# Patient Record
Sex: Female | Born: 1950 | Race: White | Hispanic: No | Marital: Married | Smoking: Never smoker
Health system: Southern US, Community
[De-identification: ages and names within clinical notes are randomized; demographics above are authoritative.]

## PROBLEM LIST (undated history)

## (undated) DIAGNOSIS — I10 Essential (primary) hypertension: Secondary | ICD-10-CM

## (undated) DIAGNOSIS — E039 Hypothyroidism, unspecified: Secondary | ICD-10-CM

## (undated) DIAGNOSIS — G4730 Sleep apnea, unspecified: Secondary | ICD-10-CM

## (undated) DIAGNOSIS — B1910 Unspecified viral hepatitis B without hepatic coma: Secondary | ICD-10-CM

## (undated) DIAGNOSIS — K589 Irritable bowel syndrome without diarrhea: Secondary | ICD-10-CM

## (undated) DIAGNOSIS — F419 Anxiety disorder, unspecified: Secondary | ICD-10-CM

## (undated) DIAGNOSIS — J45909 Unspecified asthma, uncomplicated: Secondary | ICD-10-CM

## (undated) DIAGNOSIS — M1990 Unspecified osteoarthritis, unspecified site: Secondary | ICD-10-CM

## (undated) DIAGNOSIS — F32A Depression, unspecified: Secondary | ICD-10-CM

## (undated) DIAGNOSIS — Z87442 Personal history of urinary calculi: Secondary | ICD-10-CM

## (undated) HISTORY — DX: Unspecified osteoarthritis, unspecified site: M19.90

## (undated) HISTORY — PX: LITHOTRIPSY: SUR834

## (undated) HISTORY — PX: KYPHOPLASTY: SHX5884

## (undated) HISTORY — PX: BREAST LUMPECTOMY: SHX2

## (undated) HISTORY — DX: Hypothyroidism, unspecified: E03.9

## (undated) HISTORY — PX: TOTAL ABDOMINAL HYSTERECTOMY: SHX209

## (undated) HISTORY — PX: OTHER SURGICAL HISTORY: SHX169

## (undated) HISTORY — DX: Unspecified asthma, uncomplicated: J45.909

## (undated) HISTORY — DX: Unspecified viral hepatitis B without hepatic coma: B19.10

## (undated) HISTORY — PX: KNEE SURGERY: SHX244

## (undated) HISTORY — PX: ANKLE SURGERY: SHX546

## (undated) HISTORY — PX: CHOLECYSTECTOMY: SHX55

## (undated) HISTORY — PX: BREAST BIOPSY: SHX20

## (undated) HISTORY — DX: Irritable bowel syndrome, unspecified: K58.9

## (undated) HISTORY — DX: Sleep apnea, unspecified: G47.30

---

## 2009-03-16 ENCOUNTER — Ambulatory Visit (HOSPITAL_COMMUNITY): Admission: RE | Admit: 2009-03-16 | Discharge: 2009-03-16 | Payer: Self-pay | Admitting: Orthopedic Surgery

## 2009-05-17 ENCOUNTER — Ambulatory Visit (HOSPITAL_COMMUNITY): Admission: RE | Admit: 2009-05-17 | Discharge: 2009-05-17 | Payer: Self-pay | Admitting: Orthopedic Surgery

## 2010-05-23 LAB — DIFFERENTIAL
Basophils Absolute: 0.1 10*3/uL (ref 0.0–0.1)
Lymphocytes Relative: 21 % (ref 12–46)
Neutro Abs: 7.5 10*3/uL (ref 1.7–7.7)
Neutrophils Relative %: 68 % (ref 43–77)

## 2010-05-23 LAB — CBC
HCT: 40.7 % (ref 36.0–46.0)
Hemoglobin: 13.6 g/dL (ref 12.0–15.0)
Platelets: 212 10*3/uL (ref 150–400)

## 2010-05-23 LAB — BASIC METABOLIC PANEL
CO2: 29 mEq/L (ref 19–32)
Chloride: 105 mEq/L (ref 96–112)
Creatinine, Ser: 0.75 mg/dL (ref 0.4–1.2)
Potassium: 4.3 mEq/L (ref 3.5–5.1)

## 2010-05-23 LAB — APTT: aPTT: 32 seconds (ref 24–37)

## 2010-05-23 LAB — PROTIME-INR: Prothrombin Time: 14.4 seconds (ref 11.6–15.2)

## 2011-01-13 IMAGING — CR DG ORBITS FOR FOREIGN BODY
2 series · 2 of 2 positions shown · non-contrast
Comparison: none

*RADIOLOGY  REPORT*
CLINICAL DATA: Metal working/exposure;  clearance prior to MRI

ORBITS FOR FOREIGN BODY - 2 VIEW

[view not recorded (1 of 2)]
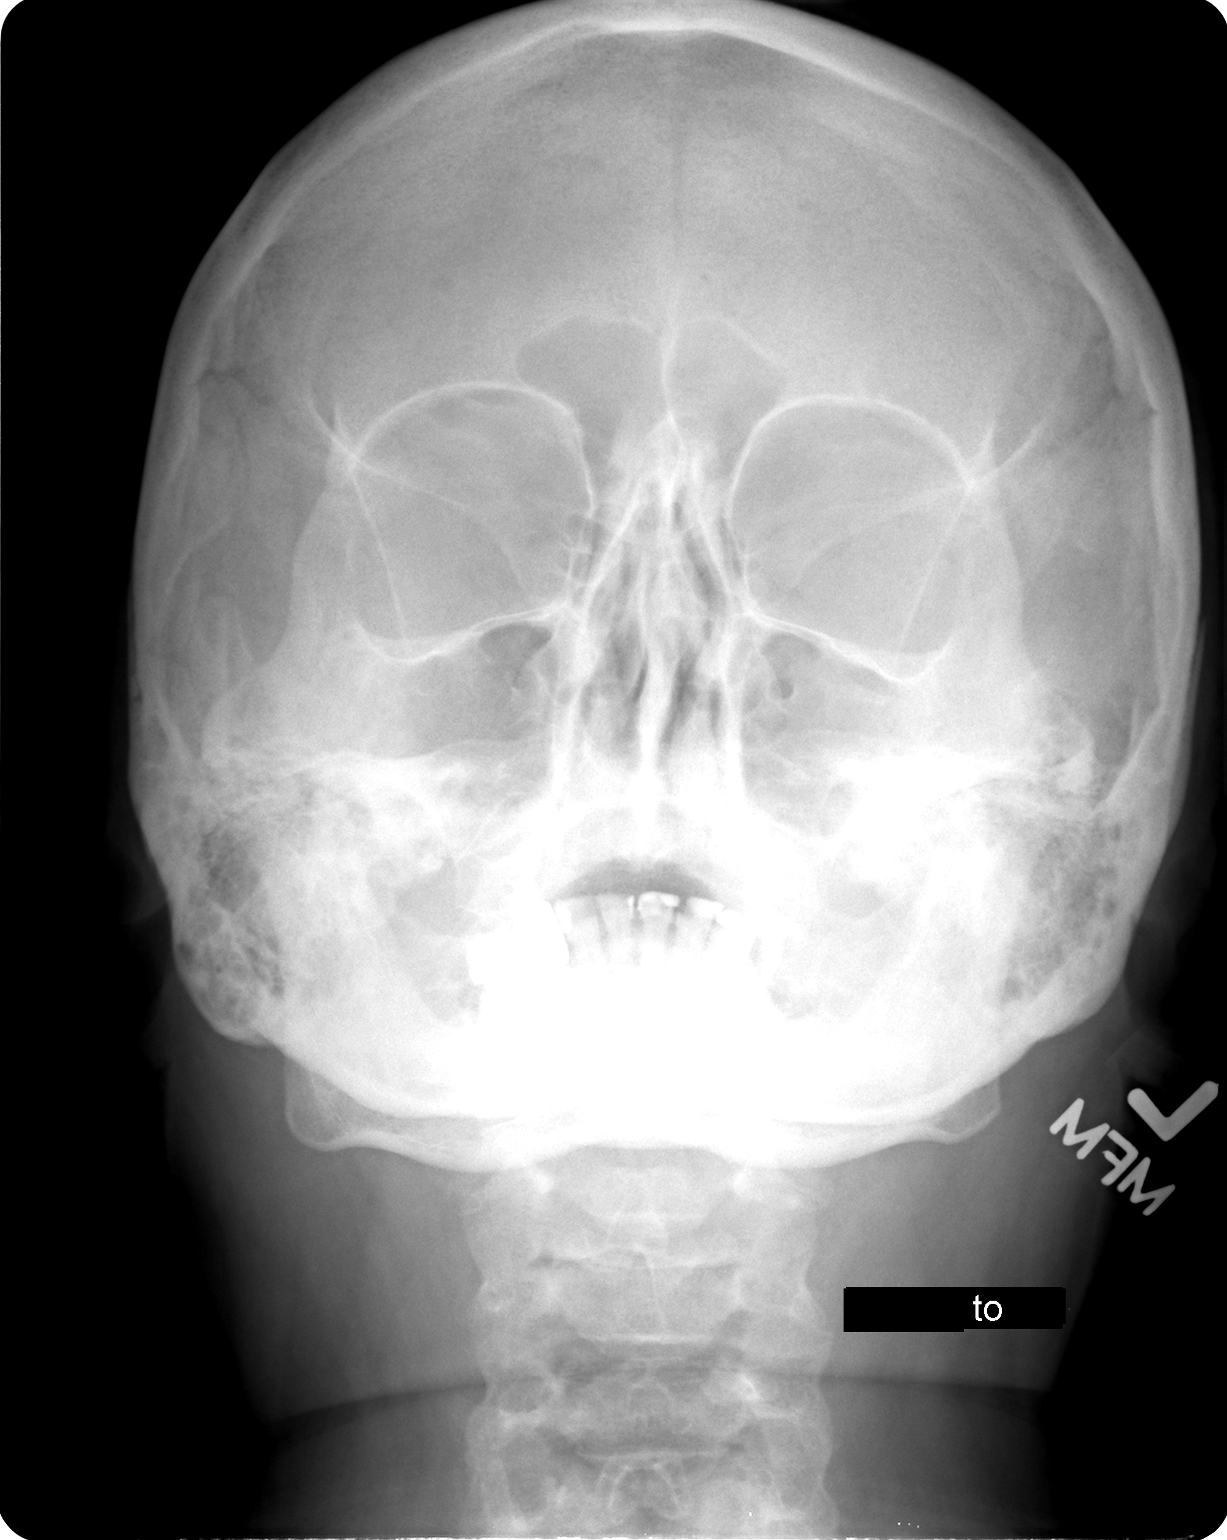

[view not recorded (2 of 2)]
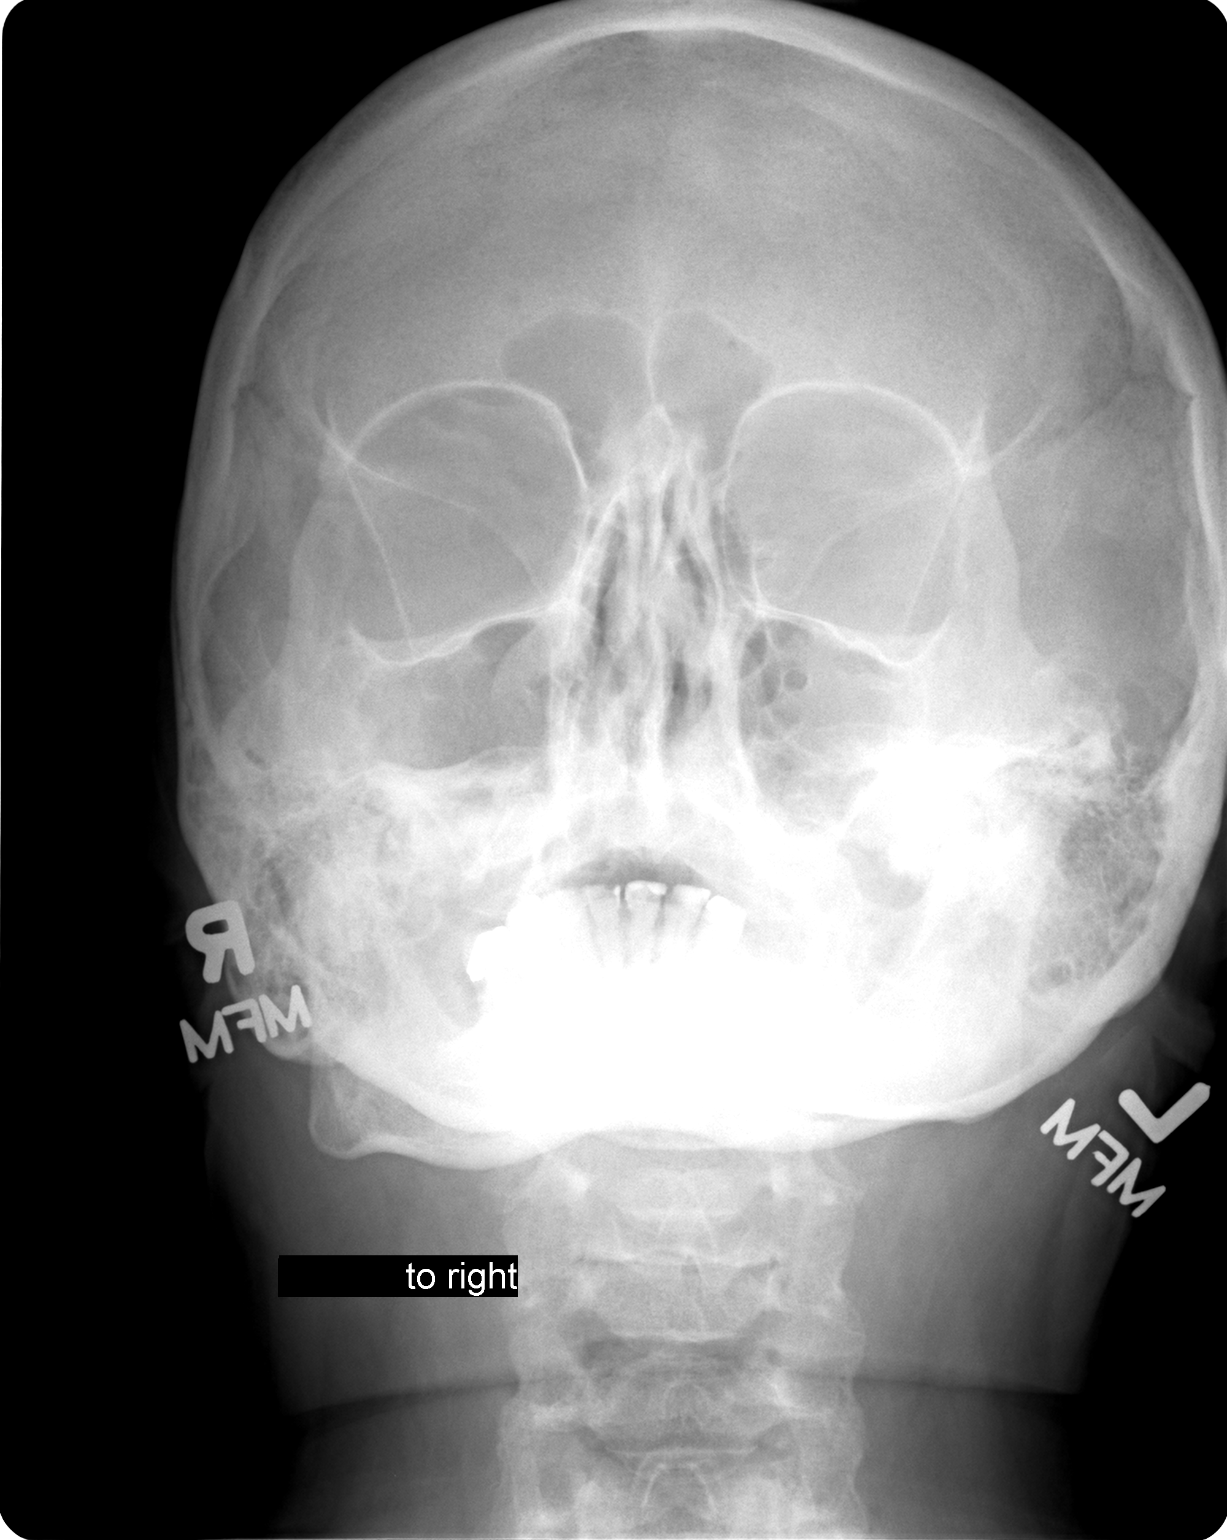

[2 of 2 positions shown; findings below may reference images not displayed]

FINDINGS: There is no evidence of metallic foreign body within the
orbits.  No significant bone abnormality identified.

IMPRESSION
No evidence of metallic foreign body within the orbits.

## 2011-03-16 IMAGING — CR DG CHEST 2V
2 series · 2 of 2 positions shown · non-contrast
Comparison: None.

CLINICAL DATA: Preop for right knee surgery

CHEST - 2 VIEW

[view not recorded (1 of 2)]
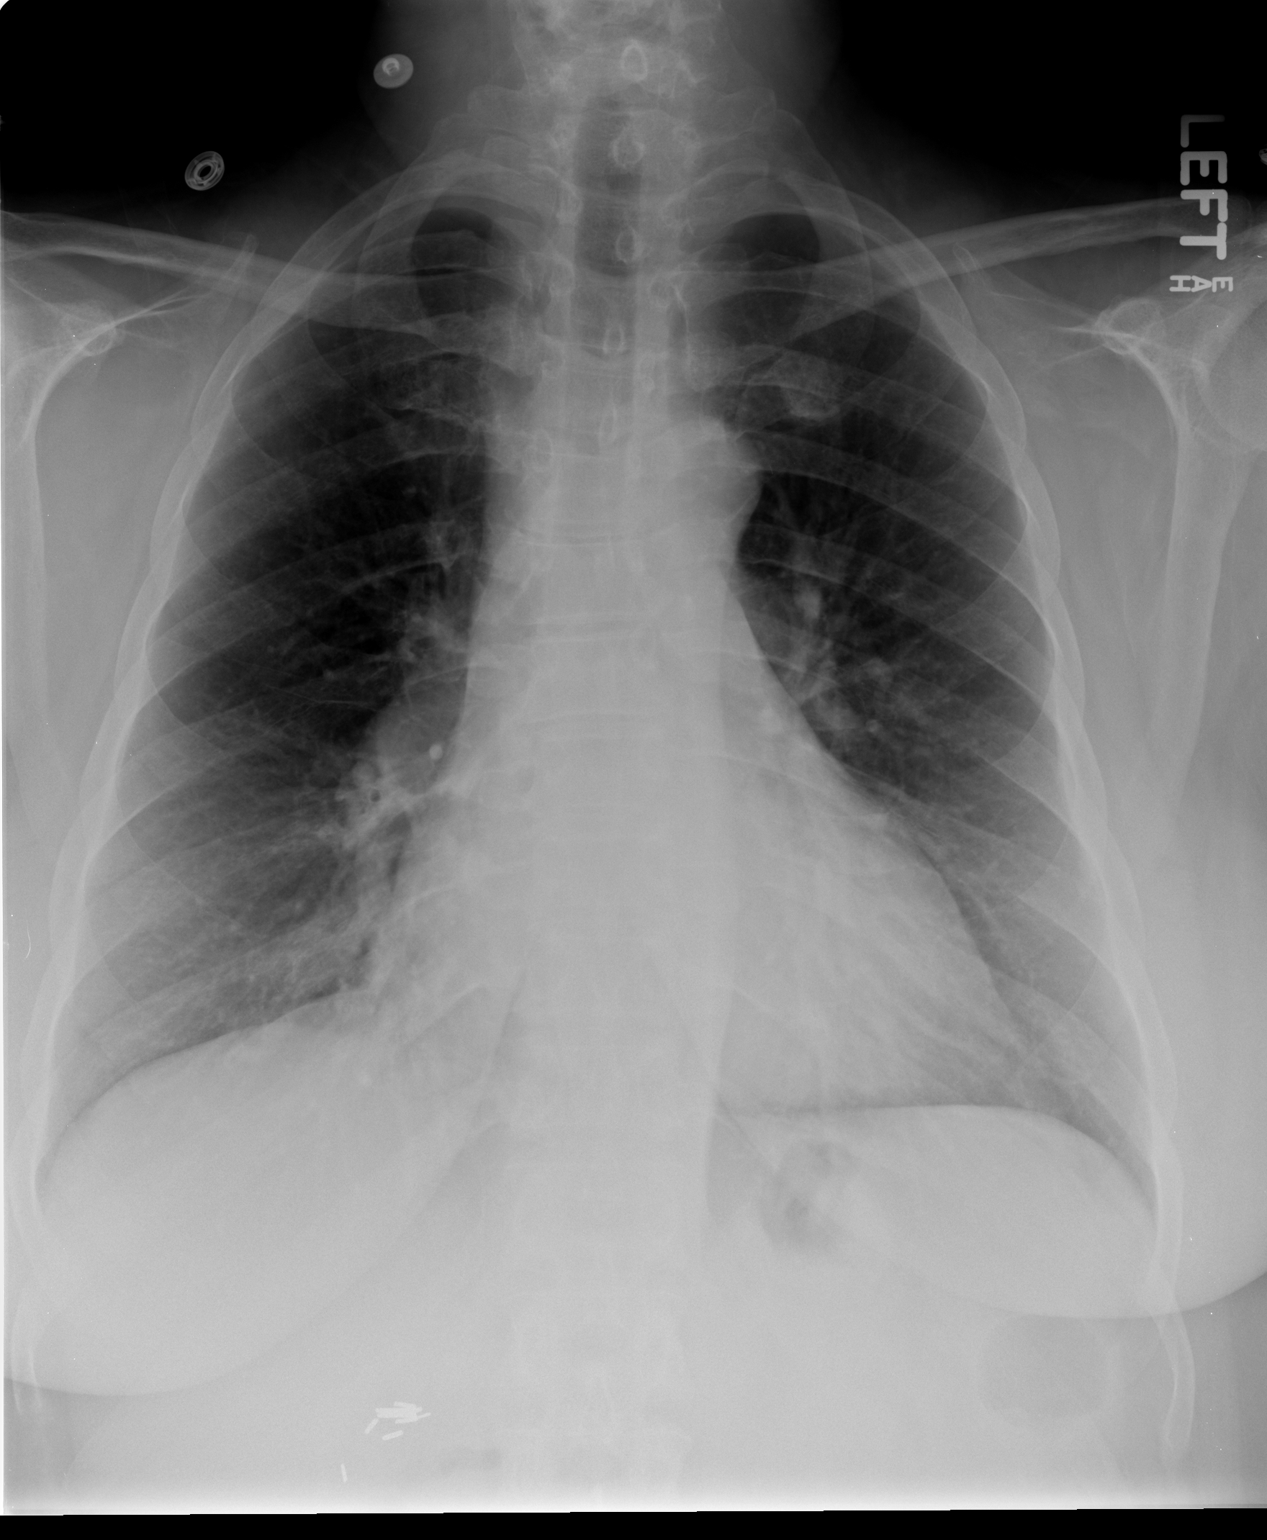

[view not recorded (2 of 2)]
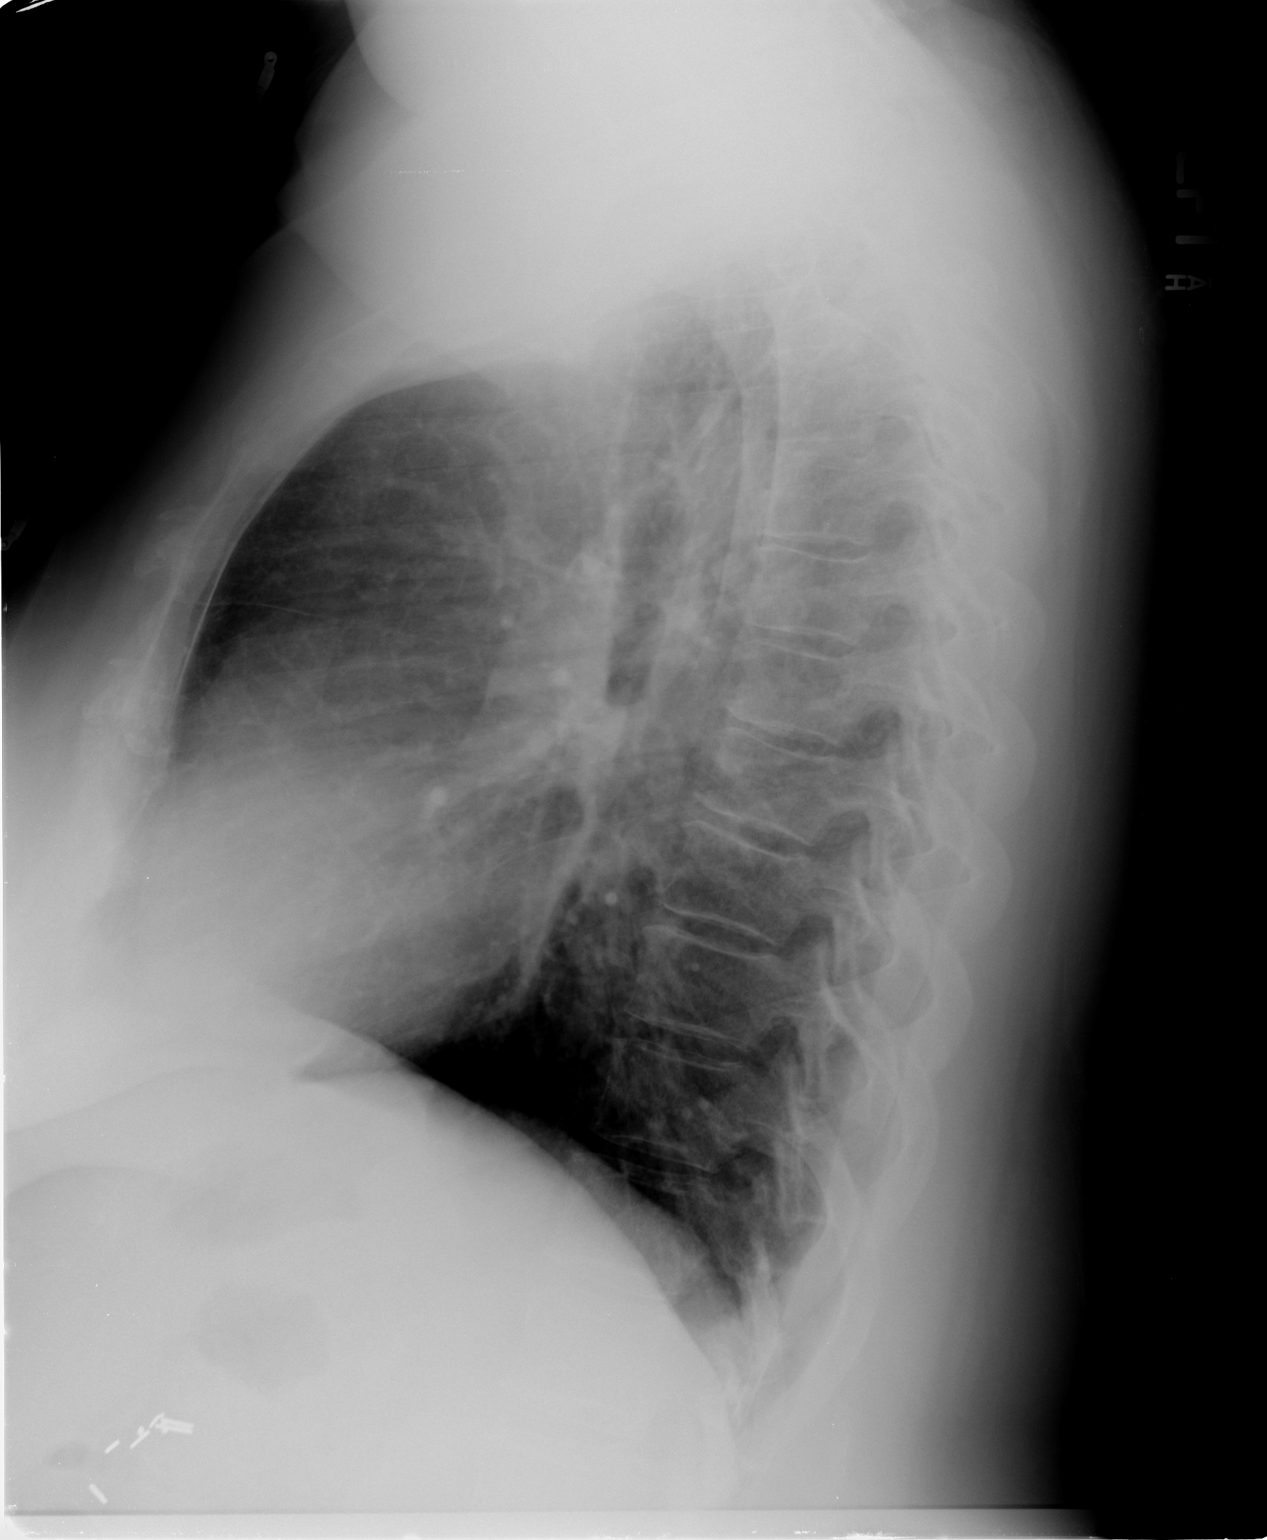

[2 of 2 positions shown; findings below may reference images not displayed]

FINDINGS: Borderline cardiomegaly noted.  No acute infiltrate or
pleural effusion.  No pulmonary edema.  Post cholecystectomy
surgical clips are noted right upper quadrant.
IMPRESSION: No active disease.  Borderline cardiomegaly.

## 2012-10-10 ENCOUNTER — Ambulatory Visit: Payer: Self-pay | Admitting: Endocrinology

## 2013-08-18 ENCOUNTER — Other Ambulatory Visit (INDEPENDENT_AMBULATORY_CARE_PROVIDER_SITE_OTHER): Payer: BC Managed Care – PPO

## 2013-08-18 ENCOUNTER — Other Ambulatory Visit: Payer: Self-pay | Admitting: *Deleted

## 2013-08-18 DIAGNOSIS — E039 Hypothyroidism, unspecified: Secondary | ICD-10-CM

## 2013-08-18 LAB — T4, FREE: Free T4: 0.54 ng/dL — ABNORMAL LOW (ref 0.60–1.60)

## 2013-08-18 LAB — TSH: TSH: 1.72 u[IU]/mL (ref 0.35–4.50)

## 2013-08-22 ENCOUNTER — Ambulatory Visit (INDEPENDENT_AMBULATORY_CARE_PROVIDER_SITE_OTHER): Payer: BC Managed Care – PPO | Admitting: Endocrinology

## 2013-08-22 ENCOUNTER — Encounter: Payer: Self-pay | Admitting: Endocrinology

## 2013-08-22 VITALS — BP 104/70 | HR 64 | Temp 98.9°F | Ht 69.0 in | Wt 280.0 lb

## 2013-08-22 DIAGNOSIS — E039 Hypothyroidism, unspecified: Secondary | ICD-10-CM | POA: Insufficient documentation

## 2013-08-22 NOTE — Progress Notes (Signed)
Patient ID: Hannah Holt, female   DOB: 22-Aug-1950, 63 y.o.   MRN: [id number]    Reason for Appointment:  Hypothyroidism, followup visit    History of Present Illness:   The hypothyroidism was first diagnosed in 1980s At that time she was having some fatigue when she was tested  and was found to be hypothyroid and was started on Synthroid.  Previously had done fairly well with Synthroid but had been on significantly variable doses over the last several years However for several years had been feeling consistently fatigued This was despite treatment for sleep apnea In 2011 she was empirically tried on Armour Thyroid instead of Synthroid With this she felt more alert although continued to have fatigue Subsequently had been requiring relatively larger doses of Armour Thyroid but has been on the same dose for at least a year now  TSH was 2.96 in 5/14 and she was continued on 120 mg of Armour Thyroid She is again complaining of feeling tired although does not have cold sensitivity or hair loss .            The patient is taking the thyroid supplement very regularly in the morning before breakfast. Not taking any calcium or iron supplements with the thyroid supplement. She prefers the smaller tablets and takes 2 of the 60 mg     Appointment on 08/18/2013  Component Date Value Ref Range Status  . TSH 08/18/2013 1.72  0.35 - 4.50 uIU/mL Final  . Free T4 08/18/2013 0.54* 0.60 - 1.60 ng/dL Final      Medication List       This list is accurate as of: 08/22/13 11:59 PM.  Always use your most recent med list.               ARMOUR THYROID 60 MG tablet  Generic drug:  thyroid        Allergies: Not on File  Past Medical History  Diagnosis Date  . Sleep apnea     No past surgical history on file.  Family History  Problem Relation Age of Onset  . Heart disease Mother   . Heart disease Father   . Thyroid disease Neg Hx     Social History:  has no tobacco, alcohol, and  drug history on file.  REVIEW Of SYSTEMS:  Sleep apnea is being treated    Examination:   BP 104/70  Pulse 64  Temp(Src) 98.9 F (37.2 C) (Oral)  Ht 5\' 9"  (1.753 m)  Wt 280 lb (127.007 kg)  BMI 41.33 kg/m2  SpO2 94%  GENERAL APPEARANCE: Alert, no puffiness of the face or eyes  NECK: Thyroid is not palpable           NEUROLOGIC EXAM:  biceps reflexes show normal relaxation Skin: Not unusual dry    Assessment   Hypothyroidism, primary and long-standing She appears to have consistently normal TSH level now with 120 mg of Armour Thyroid which she is compliant Her fatigue is unrelated to her hypothyroidism and she will followup with her PCP for this    Treatment:   Continue same dosage before breakfast daily. Avoid taking any calcium or iron supplements with the thyroid supplement.    KUMAR,AJAY 08/23/2013, 9:32 PM

## 2013-09-05 ENCOUNTER — Other Ambulatory Visit: Payer: Self-pay | Admitting: *Deleted

## 2013-09-05 MED ORDER — THYROID 60 MG PO TABS: ORAL_TABLET | ORAL | Status: DC

## 2013-09-08 ENCOUNTER — Telehealth: Payer: Self-pay | Admitting: Endocrinology

## 2013-09-08 ENCOUNTER — Other Ambulatory Visit: Payer: Self-pay | Admitting: *Deleted

## 2013-09-08 MED ORDER — LEVOTHYROXINE SODIUM 150 MCG PO TABS: 150.0000 ug | ORAL_TABLET | Freq: Every day | ORAL | Status: DC

## 2013-09-08 NOTE — Telephone Encounter (Signed)
Please see below and advise.

## 2013-09-08 NOTE — Telephone Encounter (Signed)
rx sent, unable to reach patient by phone at this time.

## 2013-09-08 NOTE — Telephone Encounter (Signed)
Patient is requesting a change of medication, she is not able to afford the Armour Thyroid 60 mg, could a generic brand be prescribed. Please advise

## 2013-09-08 NOTE — Telephone Encounter (Signed)
Do not think there is a generic for Armour Thyroid unless the pharmacist can find one. She will need to switch back to levothyroxine and use 150 mcg daily. Will also need to recheck her thyroid level in about 2 months

## 2013-09-10 ENCOUNTER — Other Ambulatory Visit: Payer: Self-pay | Admitting: *Deleted

## 2013-09-10 DIAGNOSIS — E039 Hypothyroidism, unspecified: Secondary | ICD-10-CM

## 2013-11-11 ENCOUNTER — Other Ambulatory Visit (INDEPENDENT_AMBULATORY_CARE_PROVIDER_SITE_OTHER): Payer: BC Managed Care – PPO

## 2013-11-11 DIAGNOSIS — E039 Hypothyroidism, unspecified: Secondary | ICD-10-CM

## 2013-11-11 LAB — T4, FREE: Free T4: 1.07 ng/dL (ref 0.60–1.60)

## 2013-11-11 LAB — TSH: TSH: 0.72 u[IU]/mL (ref 0.35–4.50)

## 2013-11-14 NOTE — Progress Notes (Signed)
Quick Note:  Not clear why she had thyroid tests done without followup appointment ______

## 2013-11-17 ENCOUNTER — Ambulatory Visit (INDEPENDENT_AMBULATORY_CARE_PROVIDER_SITE_OTHER): Payer: BC Managed Care – PPO | Admitting: Endocrinology

## 2013-11-17 ENCOUNTER — Encounter: Payer: Self-pay | Admitting: Endocrinology

## 2013-11-17 VITALS — BP 90/52 | HR 64 | Temp 98.1°F | Resp 16 | Ht 69.0 in | Wt 281.8 lb

## 2013-11-17 DIAGNOSIS — E039 Hypothyroidism, unspecified: Secondary | ICD-10-CM

## 2013-11-17 NOTE — Progress Notes (Addendum)
Patient ID: Hannah Holt, female   DOB: Dec 07, 1950, 63 y.o.   MRN: [id number]    Reason for Appointment:  Hypothyroidism, followup visit    History of Present Illness:   The hypothyroidism was first diagnosed in 1980s At that time she was having some fatigue when she was tested  and was found to be hypothyroid and was started on Synthroid.  Previously had done fairly well with Synthroid but had been on significantly variable doses over several years However for several years has been feeling consistently fatigued This was despite treatment for sleep apnea  In 2011 she was empirically tried on Armour Thyroid instead of Synthroid With this she felt more alert although continued to have fatigue Subsequently had been requiring relatively larger doses of Armour Thyroid  Because of cost of Armour Thyroid she asked for a less expensive option and is back on levothyroxine since 7/15  She does not feel any different with switching to levothyroxine. She says she tends to get sleepy during the day even though she wakes up feeling refreshed, this is chronic She does not have cold sensitivity or hair loss .            The patient is taking the thyroid supplement very regularly in the morning before breakfast.  Not taking any calcium or iron supplements with the thyroid supplement.   Lab Results  Component Value Date   FREET4 1.07 11/11/2013   FREET4 0.54* 08/18/2013   TSH 0.72 11/11/2013   TSH 1.72 08/18/2013      Appointment on 11/11/2013  Component Date Value Ref Range Status  . TSH 11/11/2013 0.72  0.35 - 4.50 uIU/mL Final  . Free T4 11/11/2013 1.07  0.60 - 1.60 ng/dL Final      Medication List       This list is accurate as of: 11/17/13 10:25 AM.  Always use your most recent med list.               levothyroxine 150 MCG tablet  Commonly known as:  SYNTHROID, LEVOTHROID  Take 1 tablet (150 mcg total) by mouth daily.        Allergies: No Known Allergies  Past Medical  History  Diagnosis Date  . Sleep apnea     No past surgical history on file.  Family History  Problem Relation Age of Onset  . Heart disease Mother   . Heart disease Father   . Thyroid disease Neg Hx     Social History:  has no tobacco, alcohol, and drug history on file.  REVIEW Of SYSTEMS:  Sleep apnea is being treated with CPAP    Examination:   BP 90/52  Pulse 64  Temp(Src) 98.1 F (36.7 C)  Resp 16  Ht 5\' 9"  (1.753 m)  Wt 281 lb 12.8 oz (127.824 kg)  BMI 41.60 kg/m2  SpO2 97%  GENERAL APPEARANCE: Alert, no puffiness of the face or eyes  NECK: Thyroid is not palpable           NEUROLOGIC EXAM:  biceps reflexes show normal relaxation Skin: Not unusual dry    Assessment   Hypothyroidism, primary and long-standing She appears to have  a normal TSH level now with switching to 150 mcg of levothyroxine, previously on 120 mg of Armour Thyroid  Her fatigue and somnolence is unrelated to her hypothyroidism and not different with levothyroxine  Reassured the patient that her obstructive airway symptoms have no relationship to her thyroid   Treatment:  Continue same dosage before breakfast daily. Avoid taking any calcium or iron supplements with the thyroid supplement.    Continue followup with PCP for daytime somnolence  Hannah Holt 11/17/2013, 10:37 AM

## 2014-01-04 ENCOUNTER — Other Ambulatory Visit: Payer: Self-pay | Admitting: Endocrinology

## 2014-02-06 HISTORY — PX: COLONOSCOPY: SHX174

## 2014-08-13 ENCOUNTER — Other Ambulatory Visit: Payer: BC Managed Care – PPO

## 2014-08-18 ENCOUNTER — Ambulatory Visit: Payer: BC Managed Care – PPO | Admitting: Endocrinology

## 2014-09-08 ENCOUNTER — Other Ambulatory Visit (INDEPENDENT_AMBULATORY_CARE_PROVIDER_SITE_OTHER): Payer: Self-pay

## 2014-09-08 DIAGNOSIS — E039 Hypothyroidism, unspecified: Secondary | ICD-10-CM

## 2014-09-08 LAB — TSH: TSH: 0.48 u[IU]/mL (ref 0.35–4.50)

## 2014-09-08 LAB — T4, FREE: FREE T4: 0.88 ng/dL (ref 0.60–1.60)

## 2014-09-11 ENCOUNTER — Encounter: Payer: Self-pay | Admitting: Endocrinology

## 2014-09-11 ENCOUNTER — Ambulatory Visit (INDEPENDENT_AMBULATORY_CARE_PROVIDER_SITE_OTHER): Payer: Medicare Other | Admitting: Endocrinology

## 2014-09-11 VITALS — BP 124/68 | HR 63 | Temp 98.3°F | Resp 16 | Ht 69.0 in | Wt 282.6 lb

## 2014-09-11 DIAGNOSIS — E038 Other specified hypothyroidism: Secondary | ICD-10-CM

## 2014-09-11 NOTE — Progress Notes (Signed)
Patient ID: Hannah Holt, female   DOB: 1950-06-23, 64 y.o.   MRN: [id number]    Reason for Appointment:  Hypothyroidism, followup visit    History of Present Illness:   The hypothyroidism was first diagnosed in 1980s At that time she was having some fatigue when she was tested  and was found to be hypothyroid and was started on Synthroid.  Previously had done fairly well with Synthroid but had been on significantly variable doses over the last several years However for several years had been feeling consistently fatigued This was despite treatment for sleep apnea In 2011 she was empirically tried on Armour Thyroid 120 mg instead of Synthroid  With this she felt more alert although continued to have fatigue  Subsequently had gone back to the Synthroid brand because of the cost and she does not think she felt any significantly different with this She has been on a stable dose of 150 g of levothyroxine  She is again complaining of feeling sleepy although does not have cold sensitivity or hair loss .            The patient is taking the thyroid supplement very regularly in the morning before breakfast.  Her current Generic is made by [facility] lab Not taking any calcium or iron supplements with the thyroid supplement.  Her TSH levels have not been fairly stable  Lab Results  Component Value Date   TSH 0.48 09/08/2014   TSH 0.72 11/11/2013   TSH 1.72 08/18/2013   FREET4 0.88 09/08/2014   FREET4 1.07 11/11/2013   FREET4 0.54* 08/18/2013      Medication List       This list is accurate as of: 09/11/14  9:10 AM.  Always use your most recent med list.               levothyroxine 150 MCG tablet  Commonly known as:  SYNTHROID, LEVOTHROID  TAKE 1 TABLET BY MOUTH EVERY DAY        Allergies: No Known Allergies  Past Medical History  Diagnosis Date  . Sleep apnea     No past surgical history on file.  Family History  Problem Relation Age of Onset  . Heart  disease Mother   . Heart disease Father   . Thyroid disease Neg Hx     Social History:  has no tobacco, alcohol, and drug history on file.  REVIEW Of SYSTEMS:  Sleep apnea is being treated with CPAP She is followed by her primary physician regularly   Examination:   BP 124/68 mmHg  Pulse 63  Temp(Src) 98.3 F (36.8 C)  Resp 16  Ht 5\' 9"  (1.753 m)  Wt 282 lb 9.6 oz (128.187 kg)  BMI 41.71 kg/m2  SpO2 97%  GENERAL APPEARANCE: Alert, no puffiness of the face or eyes  NECK: Thyroid is not palpable           Biceps reflexes show normal relaxation Skin: Not unusual dry    Assessment   Hypothyroidism, primary and long-standing She appears to have  a normal TSH level now with continuing 150 mcg of levothyroxine, previously on 120 mg of Armour Thyroid  Her fatigue/somnolence is unrelated to her hypothyroidism and not different with levothyroxine   Treatment:   Continue same dosage before breakfast daily. Avoid taking any calcium or iron supplements with the thyroid supplement.    Suggested seeing a sleep disorder specialist for her somnolence   Follow-up in one year  [facility]  09/11/2014, 9:10 AM

## 2014-11-07 ENCOUNTER — Other Ambulatory Visit: Payer: Self-pay | Admitting: Endocrinology

## 2014-11-09 MED ORDER — LEVOTHYROXINE SODIUM 150 MCG PO TABS: 150.0000 ug | ORAL_TABLET | Freq: Every day | ORAL | Status: DC

## 2015-09-08 ENCOUNTER — Other Ambulatory Visit (INDEPENDENT_AMBULATORY_CARE_PROVIDER_SITE_OTHER): Payer: Medicare Other

## 2015-09-08 DIAGNOSIS — E038 Other specified hypothyroidism: Secondary | ICD-10-CM | POA: Diagnosis not present

## 2015-09-08 LAB — T4, FREE: FREE T4: 1.66 ng/dL — AB (ref 0.60–1.60)

## 2015-09-08 LAB — TSH: TSH: 0.21 u[IU]/mL — AB (ref 0.35–4.50)

## 2015-09-13 ENCOUNTER — Ambulatory Visit (INDEPENDENT_AMBULATORY_CARE_PROVIDER_SITE_OTHER): Payer: Medicare Other | Admitting: Endocrinology

## 2015-09-13 ENCOUNTER — Encounter: Payer: Self-pay | Admitting: Endocrinology

## 2015-09-13 VITALS — BP 110/64 | HR 69 | Ht 69.0 in | Wt 272.0 lb

## 2015-09-13 DIAGNOSIS — E038 Other specified hypothyroidism: Secondary | ICD-10-CM

## 2015-09-13 DIAGNOSIS — E063 Autoimmune thyroiditis: Secondary | ICD-10-CM

## 2015-09-13 NOTE — Patient Instructions (Signed)
Take 1 pill only daily

## 2015-09-13 NOTE — Progress Notes (Signed)
Patient ID: Crystallee Mancillas, female   DOB: 07/23/1950, 65 y.o.   MRN: [id number]    Reason for Appointment:  Hypothyroidism, followup visit    History of Present Illness:   The hypothyroidism was first diagnosed in 1980s At that time she was having some fatigue when she was tested  and was found to be hypothyroid and was started on Synthroid.  Previously had done fairly well with Synthroid but had been on significantly variable doses over the last several years However for several years had been feeling consistently fatigued This was despite treatment for sleep apnea In 2011 she was empirically tried on Armour Thyroid 120 mg instead of Synthroid  With this she felt more alert although continued to have fatigue  Subsequently had gone back to the Synthroid brand because of the cost and she does not think she felt any significantly different with this She has been on a stable dose of 150 g of levothyroxine for some time  She is again complaining of feeling sleepy although does not have cold sensitivity or weight gain  She now says that she has been adjusting her medication on her own based on what she thinks she needs and she thinks it was causing weight gain.  For some time she had not been taking her medication regularly and for 3 months did not take any at all.  She thinks this helped her with weight loss However for the last 3 weeks she started by taking her medication and was taking 1-1/2 tablets a day .            The patient is taking the thyroid supplement very regularly in the morning before breakfast although rarely may forget and take it after eating.   Her current Generic is made by [facility] lab Not taking any calcium or iron supplements with the thyroid supplement.  Her TSH level is relatively low   Lab Results  Component Value Date   TSH 0.21* 09/08/2015   TSH 0.48 09/08/2014   TSH 0.72 11/11/2013   FREET4 1.66* 09/08/2015   FREET4 0.88 09/08/2014   FREET4  1.07 11/11/2013      Medication List       This list is accurate as of: 09/13/15  9:50 AM.  Always use your most recent med list.               levothyroxine 150 MCG tablet  Commonly known as:  SYNTHROID, LEVOTHROID  Take 1 tablet (150 mcg total) by mouth daily.        Allergies: No Known Allergies  Past Medical History  Diagnosis Date  . Sleep apnea     No past surgical history on file.  Family History  Problem Relation Age of Onset  . Heart disease Mother   . Heart disease Father   . Thyroid disease Neg Hx     Social History:  has no tobacco, alcohol, and drug history on file.  REVIEW Of SYSTEMS:    Wt Readings from Last 3 Encounters:  09/13/15 272 lb (123.378 kg)  09/11/14 282 lb 9.6 oz (128.187 kg)  11/17/13 281 lb 12.8 oz (127.824 kg)   Sleep apnea: She does not use her CPAP regularly  She is followed by her primary physician periodically   Examination:   BP 110/64 mmHg  Pulse 69  Ht 5\' 9"  (1.753 m)  Wt 272 lb (123.378 kg)  BMI 40.15 kg/m2  SpO2 96%  She looks well, no puffiness of  the face or eyes Thyroid is not palpable           Biceps reflexes show normal relaxation, no tremor Skin: Not unusual dry    Assessment   Hypothyroidism, primary and long-standing  Not clear what the degree of hypothyroidism she has shin she was able to stop her medication on her own for 3 months without significant adverse effects Her TSH is relatively low because of her taking a higher dose to last 3 weeks   Discussed with the patient that her fatigue and sleepiness is not related to the thyroid but mostly to the sleep apnea that she is not treating    Treatment:   Continue same dosage but only one tablet daily of the 150 g She will follow-up in 6 weeks to decide on her dose   Aayush Gelpi 09/13/2015, 9:50 AM

## 2015-09-14 ENCOUNTER — Other Ambulatory Visit: Payer: Self-pay | Admitting: Endocrinology

## 2015-10-18 ENCOUNTER — Encounter: Payer: Self-pay | Admitting: Endocrinology

## 2015-10-18 ENCOUNTER — Ambulatory Visit (INDEPENDENT_AMBULATORY_CARE_PROVIDER_SITE_OTHER): Payer: Medicare Other | Admitting: Endocrinology

## 2015-10-18 DIAGNOSIS — E038 Other specified hypothyroidism: Secondary | ICD-10-CM | POA: Diagnosis not present

## 2015-10-18 DIAGNOSIS — E063 Autoimmune thyroiditis: Secondary | ICD-10-CM

## 2015-10-18 LAB — T4, FREE: FREE T4: 1 ng/dL (ref 0.60–1.60)

## 2015-10-18 LAB — TSH: TSH: 0.75 u[IU]/mL (ref 0.35–4.50)

## 2015-10-18 NOTE — Progress Notes (Signed)
Patient ID: Hannah Holt, female   DOB: 1950-04-25, 65 y.o.   MRN: [id number]    Reason for Appointment:  Hypothyroidism, followup visit    History of Present Illness:   The hypothyroidism was first diagnosed in 1980s At that time she was having some fatigue when she was tested  and was found to be hypothyroid and was started on Synthroid.  Previously had done fairly well with Synthroid but had been on significantly variable doses over the last several years However for several years had been feeling consistently fatigued This was despite treatment for sleep apnea In 2011 she was empirically tried on Armour Thyroid 120 mg instead of Synthroid  With this she felt more alert although continued to have fatigue  Subsequently had gone back to the Synthroid brand because of the cost and she does not think she felt any significantly different with this  RECENT HISTORY: She has been on a stable dose of 150 g of levothyroxine for some time However on her last visit in 7/17 she had been very inconsistent with her medication, at times not taking it and right before her visit she was taking 1-1/2 tablets on her own With this her TSH was slightly low and free T4 high  She has been back on 150 g daily as directed and she thinks she feels a little more tired with reducing the dose           The patient is taking the thyroid supplement very regularly in the morning before breakfast although rarely may forget and take it after eating.   Her current Generic is made by [facility] lab Not taking any calcium or iron supplements with the thyroid supplement.  Her TSH level is pending   Lab Results  Component Value Date   TSH 0.21 (L) 09/08/2015   TSH 0.48 09/08/2014   TSH 0.72 11/11/2013   FREET4 1.66 (H) 09/08/2015   FREET4 0.88 09/08/2014   FREET4 1.07 11/11/2013      Medication List       Accurate as of 10/18/15 11:19 AM. Always use your most recent med list.            levothyroxine 150 MCG tablet Commonly known as:  SYNTHROID, LEVOTHROID Take 1 tablet (150 mcg total) by mouth daily.   levothyroxine 150 MCG tablet Commonly known as:  SYNTHROID, LEVOTHROID TAKE 1 TABLET BY MOUTH EVERY DAY       Allergies: No Known Allergies  Past Medical History:  Diagnosis Date  . Sleep apnea     No past surgical history on file.  Family History  Problem Relation Age of Onset  . Heart disease Mother   . Heart disease Father   . Thyroid disease Neg Hx     Social History:  has no tobacco, alcohol, and drug history on file.  REVIEW Of SYSTEMS:   Wt Readings from Last 3 Encounters:  10/18/15 277 lb (125.6 kg)  09/13/15 272 lb (123.4 kg)  09/11/14 282 lb 9.6 oz (128.2 kg)   Sleep apnea: She does use her CPAP regularly, She is wanting to know what sleep apnea means  She is followed by her primary physician periodically   Examination:   BP 118/67   Pulse 67   Ht 5\' 9"  (1.753 m)   Wt 277 lb (125.6 kg)   BMI 40.91 kg/m   She looks well, no puffiness of the face or eyes Skin: Not unusual dry    Assessment  Hypothyroidism, primary and long-standing  She is currently taking 150 g of levothyroxine I again offered her trial of Armour Thyroid but she does not want to more expensive medication Her fatigue is likely to be related to another issue that she needs to address with PCP    Treatment:  Check thyroid levels and decide on dosage and follow-up Continue the levothyroxine made by [facility]   [facility] 10/18/2015, 11:19 AM    Addendum: TSH normal, continue 150 levothyroxine

## 2016-01-11 ENCOUNTER — Other Ambulatory Visit: Payer: Self-pay | Admitting: Endocrinology

## 2016-10-17 ENCOUNTER — Ambulatory Visit (INDEPENDENT_AMBULATORY_CARE_PROVIDER_SITE_OTHER): Payer: Medicare Other | Admitting: Endocrinology

## 2016-10-17 ENCOUNTER — Encounter: Payer: Self-pay | Admitting: Endocrinology

## 2016-10-17 DIAGNOSIS — E063 Autoimmune thyroiditis: Secondary | ICD-10-CM

## 2016-10-17 LAB — TSH: TSH: 2.81 u[IU]/mL (ref 0.35–4.50)

## 2016-10-17 LAB — T3, FREE: T3, Free: 3.1 pg/mL (ref 2.3–4.2)

## 2016-10-17 LAB — T4, FREE: FREE T4: 0.99 ng/dL (ref 0.60–1.60)

## 2016-10-17 NOTE — Progress Notes (Signed)
Patient ID: Hannah Holt, female   DOB: [birth year], 66 y.o.   MRN: [id number]    Reason for Appointment:  Hypothyroidism, followup visit    History of Present Illness:   The hypothyroidism was first diagnosed in 1980s At that time she was having some fatigue when she was tested  and was found to be hypothyroid and was started on Synthroid.  Previously had done fairly well with Synthroid but had been on significantly variable doses over the last several years However for several years had been feeling consistently fatigued This was despite treatment for sleep apnea In 2011 she was empirically tried on Armour Thyroid 120 mg instead of Synthroid  With this she felt more alert although continued to have fatigue  Subsequently had gone back to the Synthroid brand because of the cost and she does not think she felt any significantly different with this  RECENT HISTORY: She has been on a stable dose of 150 g of levothyroxine for some time Previously in 7/17 she had been very inconsistent with her medication, at times not taking it and right before her visit she was taking 1-1/2 tablets on her own With this her TSH was slightly low and free T4 high  She has been on 150 g daily as directed and she is very consistent with this now every morning  She tends to have fatigue which is chronic and this is not any different over the last year Tends to feel somewhat warm which is not unusual, no hair loss  Not taking any calcium or iron supplements with the thyroid supplement.  Her dose was not changed on her last assessment in 09/2015  Her TSH level is pending   Lab Results  Component Value Date   TSH 0.75 10/18/2015   TSH 0.21 (L) 09/08/2015   TSH 0.48 09/08/2014   FREET4 1.00 10/18/2015   FREET4 1.66 (H) 09/08/2015   FREET4 0.88 09/08/2014    Allergies as of 10/17/2016   No Known Allergies     Medication List       Accurate as of 10/17/16 10:30 AM. Always use your  most recent med list.          levothyroxine 150 MCG tablet Commonly known as:  SYNTHROID, LEVOTHROID TAKE 1 TABLET BY MOUTH EVERY DAY       Allergies: No Known Allergies  Past Medical History:  Diagnosis Date  . Sleep apnea     No past surgical history on file.  Family History  Problem Relation Age of Onset  . Heart disease Mother   . Heart disease Father   . Thyroid disease Neg Hx     Social History:  reports that she has never smoked. She has never used smokeless tobacco. Her alcohol and drug histories are not on file.  REVIEW Of SYSTEMS:  She has gained a lot of weight  Wt Readings from Last 3 Encounters:  10/17/16 293 lb 6.4 oz (133.1 kg)  10/18/15 277 lb (125.6 kg)  09/13/15 272 lb (123.4 kg)   Sleep apnea: She does use her CPAP regularly, Recently seen by PCP  She is having some edema recently on her ankles    Examination:   BP 116/70   Pulse 70   Ht 5\' 9"  (1.753 m)   Wt 293 lb 6.4 oz (133.1 kg)   SpO2 96%   BMI 43.33 kg/m   She looks well, no puffiness of the hands or eyes Thyroid not palpable Biceps reflexes  are normal Skin: Not unusual dry    Assessment   Hypothyroidism, primary and long-standing  She is currently taking 150 g of levothyroxine once daily She does tend to have fatigue which is probably nonspecific Her weight gain is unrelated to her hypothyroidism  Edema: Clear of the etiology, discussed that usually this is not secondary to sleep apnea   Treatment:  Check thyroid levels including free T3 and decide on dosage Discussed that if free T3 is relatively low may consider combination of levothyroxine and Cytomel which should not be as expensive as Armour Thyroid  Total visit time 15 minutes  Telisa Ohlsen 10/17/2016, 10:30 AM    Addendum: TSH normal, continue 150 levothyroxine

## 2016-10-17 NOTE — Progress Notes (Signed)
Please call to let patient know that the lab results are all normal and she can continue the same dose and follow-up in one year

## 2017-05-15 ENCOUNTER — Encounter: Payer: Self-pay | Admitting: Gastroenterology

## 2017-10-19 ENCOUNTER — Ambulatory Visit: Payer: Medicare Other | Admitting: Endocrinology

## 2018-04-16 ENCOUNTER — Encounter: Payer: Self-pay | Admitting: Gastroenterology

## 2018-04-25 ENCOUNTER — Encounter: Payer: Self-pay | Admitting: Gastroenterology

## 2018-04-30 ENCOUNTER — Encounter: Payer: Self-pay | Admitting: Gastroenterology

## 2018-04-30 ENCOUNTER — Ambulatory Visit (INDEPENDENT_AMBULATORY_CARE_PROVIDER_SITE_OTHER): Payer: Medicare Other | Admitting: Gastroenterology

## 2018-04-30 ENCOUNTER — Encounter (INDEPENDENT_AMBULATORY_CARE_PROVIDER_SITE_OTHER): Payer: Self-pay

## 2018-04-30 VITALS — BP 108/72 | HR 72 | Ht 70.0 in | Wt 284.5 lb

## 2018-04-30 DIAGNOSIS — K625 Hemorrhage of anus and rectum: Secondary | ICD-10-CM | POA: Diagnosis not present

## 2018-04-30 MED ORDER — HYDROCORTISONE 2.5 % RE CREA: 1.0000 "application " | TOPICAL_CREAM | Freq: Two times a day (BID) | RECTAL | 2 refills | Status: AC

## 2018-04-30 MED ORDER — SUPREP BOWEL PREP KIT 17.5-3.13-1.6 GM/177ML PO SOLN: 1.0000 | ORAL | 0 refills | Status: DC

## 2018-04-30 MED ORDER — HYDROCORTISONE 2.5 % RE CREA: 1.0000 "application " | TOPICAL_CREAM | Freq: Two times a day (BID) | RECTAL | 2 refills | Status: DC

## 2018-04-30 NOTE — Progress Notes (Signed)
Chief Complaint: For colonoscopy  Referring Provider:  Gala Lewandowsky, MD      ASSESSMENT AND PLAN;   #1. Rectal bleeding.  Likely due to hemorrhoids.  R/O other causes.  H/O constipation.  #2. H/O colonic polyps (last colonoscopy 01/2014-poor preparation)  Plan: - Colace 1 tab po qd. - Please obtain previous blood work. (Dr Spero Curb). - HC 2.5 % PR bid x 10 days, 2 refills. - Minimize all nonsteroidals.  Increase water intake. - Colon with 2 day prep (miralax, then suprep). Discussed risks & benefits. (Risks including rare perforation req laparotomy, bleeding after biopsies/polypectomy req blood transfusion, rare chance of missing neoplasms, risks of anesthesia/sedation). Benefits outweigh the risks. Patient agrees to proceed. All the questions were answered. Consent forms given for review.  HPI:    Hannah Holt is a 68 y.o. female  With occ constipation, better with dulcolax Occ rectal bleeding - BRB, in bowl, intermittent x 1.5 months. Had poor prep during the last colonoscopy History of polyps in the remote past Due for repeat colonoscopy anyway  No rectal bleeding currently. Denies having any significant abdominal pain or rectal pain. No nausea/vomiting/heartburn/regurgitation/weight loss.  No family history of colon cancer or colonic polyps in a first-degree relative.  Moved to [place] from [place] 2007  Past GI procedures: -Colonoscopy 02/06/2014 (adult scope up to mid sigmoid colon, switched to pediatric-poor preparation right colon), moderate predominantly left colonic diverticulosis. 03/2005- [place]-had colonic polyps Past Medical History:  Diagnosis Date  . Arthritis   . Asthma   . Hepatitis B   . Hypothyroidism   . IBS (irritable bowel syndrome)   . Sleep apnea   . Sleep apnea     Past Surgical History:  Procedure Laterality Date  . ANKLE SURGERY    . CHOLECYSTECTOMY    . COLONOSCOPY  02/06/2014   Moderate predominantly left colonic  diverticulosis. Internal hemorrhoids. Limited examination due to quality.   Marland Kitchen KNEE SURGERY    . TOTAL ABDOMINAL HYSTERECTOMY      Family History  Problem Relation Age of Onset  . Heart disease Mother   . Heart disease Father   . Thyroid disease Neg Hx   . Colon cancer Neg Hx   . Esophageal cancer Neg Hx     Social History   Tobacco Use  . Smoking status: Never Smoker  . Smokeless tobacco: Never Used  Substance Use Topics  . Alcohol use: Yes    Comment: socially  . Drug use: Never    Current Outpatient Medications  Medication Sig Dispense Refill  . levothyroxine (SYNTHROID, LEVOTHROID) 150 MCG tablet TAKE 1 TABLET BY MOUTH EVERY DAY 30 tablet 3   No current facility-administered medications for this visit.     No Known Allergies  Review of Systems:  Constitutional: Denies fever, chills, diaphoresis, appetite change and has fatigue.  HEENT: Denies photophobia, eye pain, redness, hearing loss, ear pain, congestion, sore throat, rhinorrhea, sneezing, mouth sores, neck pain, neck stiffness and tinnitus.   Respiratory: Denies SOB, DOE, cough, chest tightness,  and wheezing.   Cardiovascular: Denies chest pain, palpitations and leg swelling.  Genitourinary: Denies dysuria, urgency, frequency, hematuria, flank pain and difficulty urinating.  Musculoskeletal: Denies myalgias, back pain, joint swelling, arthralgias and gait problem.  Skin: No rash.  Neurological: Denies dizziness, seizures, syncope, weakness, light-headedness, numbness and headaches.  Hematological: Denies adenopathy. Easy bruising, personal or family bleeding history  Psychiatric/Behavioral: No anxiety or depression.  Has sleeping problems.     Physical  Exam:    BP 108/72   Pulse 72   Ht 5\' 10"  (1.778 m)   Wt 284 lb 8 oz (129 kg)   SpO2 99%   BMI 40.82 kg/m  Filed Weights   04/30/18 1351  Weight: 284 lb 8 oz (129 kg)   Constitutional:  Well-developed, in no acute distress. Psychiatric: Normal mood  and affect. Behavior is normal. HEENT: Pupils normal.  Conjunctivae are normal. No scleral icterus. Neck supple.  Cardiovascular: Normal rate, regular rhythm. No edema Pulmonary/chest: Effort normal and breath sounds normal. No wheezing, rales or rhonchi. Abdominal: Soft, nondistended. Nontender. Bowel sounds active throughout. There are no masses palpable. No hepatomegaly. Rectal: Would like to get it done at the time of colonoscopy. Neurological: Alert and oriented to person place and time. Skin: Skin is warm and dry. No rashes noted.  Data Reviewed: I have personally reviewed following labs and imaging studies  CBC: CBC Latest Ref Rng & Units 05/17/2009  WBC 4.0 - 10.5 K/uL 11.0(H)  Hemoglobin 12.0 - 15.0 g/dL 13.6  Hematocrit 36.0 - 46.0 % 40.7  Platelets 150 - 400 K/uL 212    CMP: CMP Latest Ref Rng & Units 05/17/2009  Glucose 70 - 99 mg/dL 94  BUN 6 - 23 mg/dL 9  Creatinine 0.4 - 1.2 mg/dL 0.75  Sodium 135 - 145 mEq/L 143  Potassium 3.5 - 5.1 mEq/L 4.3  Chloride 96 - 112 mEq/L 105  CO2 19 - 32 mEq/L 29  Calcium 8.4 - 10.5 mg/dL 9.1      Carmell Austria, MD 04/30/2018, 2:11 PM  Cc: Gala Lewandowsky, MD

## 2018-04-30 NOTE — Patient Instructions (Addendum)
If you are age 69 or older, your body mass index should be between 23-30. Your Body mass index is 40.82 kg/m. If this is out of the aforementioned range listed, please consider follow up with your Primary Care Provider.  If you are age 68 or younger, your body mass index should be between 19-25. Your Body mass index is 40.82 kg/m. If this is out of the aformentioned range listed, please consider follow up with your Primary Care Provider.   Please purchase the following medications over the counter and take as directed: Colace once daily.   We have sent the following medications to your pharmacy for you to pick up at your convenience: Suprep Hydrocortisone   Two days before your procedure: Mix 3 packs (or capfuls) of Miralax in 48 ounces of clear liquid and drink at 6pm.   Thank you,  Dr. Jackquline Denmark

## 2018-05-06 ENCOUNTER — Telehealth: Payer: Self-pay | Admitting: Gastroenterology

## 2018-05-06 NOTE — Telephone Encounter (Signed)
Pt has upcoming colonoscopy 3.16.20 and stated that she cannot afford Suprep.

## 2018-05-06 NOTE — Telephone Encounter (Signed)
Called and spoke to patient, patient is unable to drive to [facility] for a sample. I have mailed patient new instructions for Miralax. Patient voiced understanding.

## 2018-05-13 ENCOUNTER — Ambulatory Visit (AMBULATORY_SURGERY_CENTER): Payer: Medicare Other | Admitting: Gastroenterology

## 2018-05-13 ENCOUNTER — Encounter: Payer: Self-pay | Admitting: Gastroenterology

## 2018-05-13 ENCOUNTER — Other Ambulatory Visit: Payer: Self-pay

## 2018-05-13 VITALS — BP 117/69 | HR 72 | Temp 98.2°F | Resp 15 | Ht 70.0 in | Wt 284.0 lb

## 2018-05-13 DIAGNOSIS — D125 Benign neoplasm of sigmoid colon: Secondary | ICD-10-CM

## 2018-05-13 DIAGNOSIS — K640 First degree hemorrhoids: Secondary | ICD-10-CM

## 2018-05-13 DIAGNOSIS — D122 Benign neoplasm of ascending colon: Secondary | ICD-10-CM | POA: Diagnosis present

## 2018-05-13 DIAGNOSIS — K5730 Diverticulosis of large intestine without perforation or abscess without bleeding: Secondary | ICD-10-CM

## 2018-05-13 DIAGNOSIS — K635 Polyp of colon: Secondary | ICD-10-CM | POA: Diagnosis not present

## 2018-05-13 DIAGNOSIS — K648 Other hemorrhoids: Secondary | ICD-10-CM

## 2018-05-13 DIAGNOSIS — K625 Hemorrhage of anus and rectum: Secondary | ICD-10-CM

## 2018-05-13 MED ORDER — SODIUM CHLORIDE 0.9 % IV SOLN: 500.0000 mL | Freq: Once | INTRAVENOUS | Status: DC

## 2018-05-13 MED ORDER — HYDROCORTISONE 2.5 % RE CREA: 1.0000 "application " | TOPICAL_CREAM | Freq: Two times a day (BID) | RECTAL | 6 refills | Status: DC | PRN

## 2018-05-13 NOTE — Op Note (Signed)
[facility] Patient Name: Hannah Holt Procedure Date: 05/13/2018 2:19 PM MRN: [id number] Endoscopist: Jackquline Denmark , MD Age: 68 Referring MD:  Date of Birth: Feb 16, 1951 Gender: Female Account #: [id number] Procedure:                Colonoscopy Indications:              Rectal bleeding, history of colonic polyps. Medicines:                Monitored Anesthesia Care Procedure:                Pre-Anesthesia Assessment:                           - Prior to the procedure, a History and Physical                            was performed, and patient medications and                            allergies were reviewed. The patient's tolerance of                            previous anesthesia was also reviewed. The risks                            and benefits of the procedure and the sedation                            options and risks were discussed with the patient.                            All questions were answered, and informed consent                            was obtained. Prior Anticoagulants: The patient has                            taken no previous anticoagulant or antiplatelet                            agents. ASA Grade Assessment: II - A patient with                            mild systemic disease. After reviewing the risks                            and benefits, the patient was deemed in                            satisfactory condition to undergo the procedure.                           After obtaining informed consent, the colonoscope  was passed under direct vision. Throughout the                            procedure, the patient's blood pressure, pulse, and                            oxygen saturations were monitored continuously. The                            Colonoscope was introduced through the anus and                            advanced to the 3 cm into the ileum. The                            colonoscopy was  technically difficult and complex                            due to a tortuous colon. Successful completion of                            the procedure was aided by applying abdominal                            pressure. The patient tolerated the procedure well.                            The quality of the bowel preparation was adequate                            to identify polyps. Scope In: 2:36:17 PM Scope Out: 3:02:11 PM Scope Withdrawal Time: 0 hours 16 minutes 22 seconds  Total Procedure Duration: 0 hours 25 minutes 54 seconds  Findings:                 Four sessile polyps were found in the distal                            sigmoid colon and proximal ascending colon. The                            polyps were 2 to 6 mm in size. These polyps were                            removed with a cold snare. Resection and retrieval                            were complete. Estimated blood loss: none.                           Multiple medium-mouthed diverticula were found in                            the sigmoid  colon and few in descending colon.                           Non-bleeding internal hemorrhoids were found during                            retroflexion. The hemorrhoids were small.                           The exam was otherwise without abnormality on                            direct and retroflexion views.                           The terminal ileum appeared normal. Complications:            No immediate complications. Estimated Blood Loss:     Estimated blood loss: none. Impression:               -Small colonic polyps s/p polypectomy.                           -Moderate predominantly sigmoid diverticulosis.                           -Small internal hemorrhoids.                           -Otherwise normal colonoscopy to TI. Recommendation:           - Patient has a contact number available for                            emergencies. The signs and symptoms of potential                             delayed complications were discussed with the                            patient. Return to normal activities tomorrow.                            Written discharge instructions were provided to the                            patient.                           - High fiber diet.                           - Continue present medications.                           - Await pathology results.                           -  Repeat colonoscopy for surveillance based on                            pathology results.                           - Use HC Cream 2.5%: Apply externally BID PRN. Jackquline Denmark, MD 05/13/2018 3:07:52 PM This report has been signed electronically.

## 2018-05-13 NOTE — Progress Notes (Signed)
A and O x3. Report to RN. Tolerated MAC anesthesia well.

## 2018-05-13 NOTE — Patient Instructions (Signed)
YOU HAD AN ENDOSCOPIC PROCEDURE TODAY AT [facility]:   Refer to the procedure report that was given to you for any specific questions about what was found during the examination.  If the procedure report does not answer your questions, please call your gastroenterologist to clarify.  If you requested that your care partner not be given the details of your procedure findings, then the procedure report has been included in a sealed envelope for you to review at your convenience later.  YOU SHOULD EXPECT: Some feelings of bloating in the abdomen. Passage of more gas than usual.  Walking can help get rid of the air that was put into your GI tract during the procedure and reduce the bloating. If you had a lower endoscopy (such as a colonoscopy or flexible sigmoidoscopy) you may notice spotting of blood in your stool or on the toilet paper. If you underwent a bowel prep for your procedure, you may not have a normal bowel movement for a few days.  Please Note:  You might notice some irritation and congestion in your nose or some drainage.  This is from the oxygen used during your procedure.  There is no need for concern and it should clear up in a day or so.  SYMPTOMS TO REPORT IMMEDIATELY:   Following lower endoscopy (colonoscopy or flexible sigmoidoscopy):  Excessive amounts of blood in the stool  Significant tenderness or worsening of abdominal pains  Swelling of the abdomen that is new, acute  Fever of 100F or higher  For urgent or emergent issues, a gastroenterologist can be reached at any hour by calling [phone].   DIET:  We do recommend a small meal at first, but then you may proceed to your regular diet.  Drink plenty of fluids but you should avoid alcoholic beverages for 24 hours.  ACTIVITY:  You should plan to take it easy for the rest of today and you should NOT DRIVE or use heavy machinery until tomorrow (because of the sedation medicines used during the test).     FOLLOW UP: Our staff will call the number listed on your records the next business day following your procedure to check on you and address any questions or concerns that you may have regarding the information given to you following your procedure. If we do not reach you, we will leave a message.  However, if you are feeling well and you are not experiencing any problems, there is no need to return our call.  We will assume that you have returned to your regular daily activities without incident.  If any biopsies were taken you will be contacted by phone or by letter within the next 1-3 weeks.  Please call us at [phone] if you have not heard about the biopsies in 3 weeks.    SIGNATURES/CONFIDENTIALITY: You and/or your care partner have signed paperwork which will be entered into your electronic medical record.  These signatures attest to the fact that that the information above on your After Visit Summary has been reviewed and is understood.  Full responsibility of the confidentiality of this discharge information lies with you and/or your care-partner.     Handouts were given to your care partner on polyps, diverticulosis, hemorrhoids, and a high fiber diet with a liberal fluid intake. Rx was sent To [facility] for [facility] cream. You may resume your current medications today. Await biopsy results. Please call if any questions or concerns.

## 2018-05-13 NOTE — Progress Notes (Signed)
No problems noted in the recovery room. maw 

## 2018-05-14 ENCOUNTER — Telehealth: Payer: Self-pay | Admitting: *Deleted

## 2018-05-14 NOTE — Telephone Encounter (Signed)
  Follow up Call-  Call back number 05/13/2018  Post procedure Call Back phone  # [phone]  Permission to leave phone message Yes  Some recent data might be hidden     Patient questions:  Do you have a fever, pain , or abdominal swelling? No. Pain Score  0 *  Have you tolerated food without any problems? Yes.    Have you been able to return to your normal activities? Yes.    Do you have any questions about your discharge instructions: Diet   No. Medications  No. Follow up visit  No.  Do you have questions or concerns about your Care? No.  Actions: * If pain score is 4 or above: No action needed, pain <4.

## 2018-05-21 ENCOUNTER — Encounter: Payer: Self-pay | Admitting: Gastroenterology

## 2020-10-12 ENCOUNTER — Ambulatory Visit (INDEPENDENT_AMBULATORY_CARE_PROVIDER_SITE_OTHER): Payer: Medicare Other

## 2020-10-12 ENCOUNTER — Other Ambulatory Visit: Payer: Self-pay

## 2020-10-12 ENCOUNTER — Ambulatory Visit (INDEPENDENT_AMBULATORY_CARE_PROVIDER_SITE_OTHER): Payer: Medicare Other | Admitting: Sports Medicine

## 2020-10-12 ENCOUNTER — Encounter: Payer: Self-pay | Admitting: Sports Medicine

## 2020-10-12 DIAGNOSIS — M722 Plantar fascial fibromatosis: Secondary | ICD-10-CM

## 2020-10-12 DIAGNOSIS — M216X1 Other acquired deformities of right foot: Secondary | ICD-10-CM

## 2020-10-12 DIAGNOSIS — M7661 Achilles tendinitis, right leg: Secondary | ICD-10-CM

## 2020-10-12 DIAGNOSIS — M79671 Pain in right foot: Secondary | ICD-10-CM

## 2020-10-12 MED ORDER — PREDNISONE 10 MG (21) PO TBPK: ORAL_TABLET | ORAL | 0 refills | Status: DC

## 2020-10-12 NOTE — Progress Notes (Signed)
Subjective: Hannah Holt is a 70 y.o. 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  Patient has tried heel with Biofreeze and icing.  As well for her symptoms.  Patient Active Problem List   Diagnosis Date Noted   Unspecified hypothyroidism 08/22/2013    Current Outpatient Medications on File Prior to Visit  Medication Sig Dispense Refill   levothyroxine (SYNTHROID) 75 MCG tablet levothyroxine 75 mcg tablet  Take 1 tablet every day by oral route before meals.     albuterol (VENTOLIN HFA) 108 (90 Base) MCG/ACT inhaler albuterol sulfate HFA 90 mcg/actuation aerosol inhaler  INHALE 2 PUFFS BY MOUTH EVERY 4 TO 6 HOURS AS NEEDED FOR COUGH AND FOR WHEEZING     fexofenadine-pseudoephedrine (ALLEGRA-D ALLERGY & CONGESTION) 60-120 MG 12 hr tablet Allegra-D 12 Hour 60 mg-120 mg tablet,extended release  TAKE 1 TABLET BY MOUTH EVERY 12 HOURS AS NEEDED FOR SINUS CONGESTION AND EAR POPPING     hydrocortisone (ANUSOL-HC) 2.5 % rectal cream Place 1 application rectally 2 (two) times daily as needed for hemorrhoids or anal itching. 30 g 6   levothyroxine (SYNTHROID, LEVOTHROID) 150 MCG tablet TAKE 1 TABLET BY MOUTH EVERY DAY 30 tablet 3   meclizine (ANTIVERT) 25 MG tablet meclizine 25 mg tablet  Take 1 tablet 3 times a day by oral route as needed.  may cause drowsiness     pantoprazole (PROTONIX) 40 MG tablet pantoprazole 40 mg tablet,delayed release  Take 1 tablet every day by oral route.     SUPREP BOWEL PREP KIT 17.5-3.13-1.6 GM/177ML SOLN Take 1 kit by mouth as directed. 354 mL 0   No current facility-administered medications on file prior to visit.    No Known Allergies  Objective:  General: Alert and oriented x3 in no acute distress  Dermatology: No  open lesions bilateral lower extremities, no webspace macerations, no ecchymosis bilateral, all nails x 10 are well manicured but discolored at the ends right greater than left foot.  Vascular: Dorsalis Pedis and Posterior Tibial pedal pulses 1/4, Capillary Fill Time 3 seconds, + pedal hair growth bilateral, no edema bilateral lower extremities, Temperature gradient within normal limits.  Neurology: Johney Maine sensation intact via light touch bilateral.  Musculoskeletal: Mild tenderness with palpation at insertion of the Achilles at the back greater than the plantar fascial insertion on the bottom of the right heel.  There is calcaneal exostosis with mild soft tissue swelling at the posterior aspect of the right heel.  Achilles tendon feels intact with no nodularity or palpable dell.  Thompson sign is negative.  No other pedal deformities noted.    Gait: Antalgic gait with increased heel off right  Xrays  Right foot   Impression: Normal osseous mineralization. Joint spaces preserved. No fracture/dislocation/boney destruction.  Posterior calcaneal spur present. Kager's triangle intact with no obliteration. No soft tissue abnormalities or radiopaque foreign bodies.   Assessment and Plan: Problem List Items Addressed This Visit   None Visit Diagnoses     Achilles tendinitis of right lower extremity    -  Primary   Relevant Orders   DG Foot Complete Right   Acquired equinus deformity of right foot       Pain of  right heel           -Complete examination performed -Xrays reviewed -Discussed treatement options for Achilles tendinitis greater than plantar fasciitis on right heel -No injection given at this time since most of her pain is at the back of the heel -Rx night splint to use as directed and advocated gentle stretching and icing  -Prescribed prednisone Dosepak for patient to take as directed  -Continue with good supportive shoes and heel lifts  -Advised patient if symptoms fail to  improve 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  Landis Martins, DPM

## 2020-10-12 NOTE — Patient Instructions (Addendum)
Vinegar soaks 1 cup of white distilled vinegar to 8 cups of warm water.  Soak 20 mins. May repeat soak two times per week.  If there is thickness to nails may file nails after soaks or after bath/shower with nail file and apply  vicks or tea tree oil. Apply oil daily to nails after filing for the best result.

## 2022-04-04 NOTE — H&P (Signed)
Patient's anticipated LOS is less than 2 midnights, meeting these requirements: - Younger than 5 - Lives within 1 hour of care - Has a competent adult at home to recover with post-op recover - NO history of  - Chronic pain requiring opiods  - Diabetes  - Coronary Artery Disease  - Heart failure  - Heart attack  - Stroke  - DVT/VTE  - Cardiac arrhythmia  - Respiratory Failure/COPD  - Renal failure  - Anemia  - Advanced Liver disease     Hannah Holt is an 72 y.o. female.    Chief Complaint: right knee pain  HPI: Pt is a 72 y.o. female complaining of right knee pain for multiple years. Pain had continually increased since the beginning. X-rays in the clinic show end-stage arthritic changes of the right knee. Pt has tried various conservative treatments which have failed to alleviate their symptoms, including injections and therapy. Various options are discussed with the patient. Risks, benefits and expectations were discussed with the patient. Patient understand the risks, benefits and expectations and wishes to proceed with surgery.   PCP:  Gala Lewandowsky, MD  D/C Plans: Home  PMH: Past Medical History:  Diagnosis Date   Arthritis    Asthma    Hepatitis B    Hypothyroidism    IBS (irritable bowel syndrome)    Sleep apnea    Sleep apnea     PSH: Past Surgical History:  Procedure Laterality Date   ANKLE SURGERY     CHOLECYSTECTOMY     COLONOSCOPY  02/06/2014   Moderate predominantly left colonic diverticulosis. Internal hemorrhoids. Limited examination due to quality.    KNEE SURGERY     TOTAL ABDOMINAL HYSTERECTOMY      Social History:  reports that she has never smoked. She has never used smokeless tobacco. She reports current alcohol use. She reports that she does not use drugs. BMI: Estimated body mass index is 40.75 kg/m as calculated from the following:   Height as of 05/13/18: '5\' 10"'$  (1.778 m).   Weight as of 05/13/18: 128.8 kg.  No results found  for: "ALBUMIN" Diabetes: Patient does not have a diagnosis of diabetes.     Smoking Status:   reports that she has never smoked. She has never used smokeless tobacco.    Allergies:  No Known Allergies  Medications: No current facility-administered medications for this encounter.   Current Outpatient Medications  Medication Sig Dispense Refill   albuterol (VENTOLIN HFA) 108 (90 Base) MCG/ACT inhaler albuterol sulfate HFA 90 mcg/actuation aerosol inhaler  INHALE 2 PUFFS BY MOUTH EVERY 4 TO 6 HOURS AS NEEDED FOR COUGH AND FOR WHEEZING     fexofenadine-pseudoephedrine (ALLEGRA-D ALLERGY & CONGESTION) 60-120 MG 12 hr tablet Allegra-D 12 Hour 60 mg-120 mg tablet,extended release  TAKE 1 TABLET BY MOUTH EVERY 12 HOURS AS NEEDED FOR SINUS CONGESTION AND EAR POPPING     hydrocortisone (ANUSOL-HC) 2.5 % rectal cream Place 1 application rectally 2 (two) times daily as needed for hemorrhoids or anal itching. 30 g 6   levothyroxine (SYNTHROID) 75 MCG tablet levothyroxine 75 mcg tablet  Take 1 tablet every day by oral route before meals.     levothyroxine (SYNTHROID, LEVOTHROID) 150 MCG tablet TAKE 1 TABLET BY MOUTH EVERY DAY 30 tablet 3   meclizine (ANTIVERT) 25 MG tablet meclizine 25 mg tablet  Take 1 tablet 3 times a day by oral route as needed.  may cause drowsiness     pantoprazole (PROTONIX) 40 MG tablet  pantoprazole 40 mg tablet,delayed release  Take 1 tablet every day by oral route.     predniSONE (STERAPRED UNI-PAK 21 TAB) 10 MG (21) TBPK tablet Take as directed 21 tablet 0   SUPREP BOWEL PREP KIT 17.5-3.13-1.6 GM/177ML SOLN Take 1 kit by mouth as directed. 354 mL 0    No results found for this or any previous visit (from the past 48 hour(s)). No results found.  ROS: Pain with rom of the right lowe extremity  Physical Exam: Alert and oriented 72 y.o. female in no acute distress Cranial nerves 2-12 intact Cervical spine: full rom with no tenderness, nv intact distally Chest:  active breath sounds bilaterally, no wheeze rhonchi or rales Heart: regular rate and rhythm, no murmur Abd: non tender non distended with active bowel sounds Hip is stable with rom  Right knee painful rom with crepitus Nv intact distally No rashes or edema distally  Assessment/Plan Assessment: right knee end stage osteoarthritis  Plan:  Patient will undergo a right total knee by Dr. Veverly Fells at [facility] Risks benefits and expectations were discussed with the patient. Patient understand risks, benefits and expectations and wishes to proceed. Preoperative templating of the joint replacement has been completed, documented, and submitted to the Operating Room personnel in order to optimize intra-operative equipment management.   Merla Riches PA-C, MPAS [facility] is now [employer] [address], [place], [place], [place] [address] Phone: [phone] [email or web address] Facebook  [employer]

## 2022-04-11 NOTE — Patient Instructions (Addendum)
DUE TO COVID-19 ONLY TWO VISITORS  (aged 73 and older)  ARE ALLOWED TO COME WITH YOU AND STAY IN THE WAITING ROOM ONLY DURING PRE OP AND PROCEDURE.   **NO VISITORS ARE ALLOWED IN THE SHORT STAY AREA OR RECOVERY ROOM!!**  IF YOU WILL BE ADMITTED INTO THE HOSPITAL YOU ARE ALLOWED ONLY FOUR SUPPORT PEOPLE DURING VISITATION HOURS ONLY (7 AM -8PM)   The support person(s) must pass our screening, gel in and out, and wear a mask at all times, including in the patient's room. Patients must also wear a mask when staff or their support person are in the room. Visitors GUEST BADGE MUST BE WORN VISIBLY  One adult visitor may remain with you overnight and MUST be in the room by 8 P.M.     Your procedure is scheduled on: 04/21/22   Report to [facility] Main Entrance    Report to admitting at : 7:30 AM   Call this number if you have problems the morning of surgery [phone]   Do not eat food :After Midnight.   After Midnight you may have the following liquids until: 7:00 AM DAY OF SURGERY  Water Black Coffee (sugar ok, NO MILK/CREAM OR CREAMERS)  Tea (sugar ok, NO MILK/CREAM OR CREAMERS) regular and decaf                             Plain Jell-O (NO RED)                                           Fruit ices (not with fruit pulp, NO RED)                                     Popsicles (NO RED)                                                                  Juice: apple, WHITE grape, WHITE cranberry Sports drinks like Gatorade (NO RED)   The day of surgery:  Drink ONE (1) Pre-Surgery Clear Ensure at: 7:00 AM the morning of surgery. Drink in one sitting. Do not sip.  This drink was given to you during your hospital  pre-op appointment visit. Nothing else to drink after completing the  Pre-Surgery Clear Ensure or G2.          If you have questions, please contact your surgeon's office.  Oral Hygiene is also important to reduce your risk of infection.                                     Remember - BRUSH YOUR TEETH THE MORNING OF SURGERY WITH YOUR REGULAR TOOTHPASTE  DENTURES WILL BE REMOVED PRIOR TO SURGERY PLEASE DO NOT APPLY "Poly grip" OR ADHESIVES!!!   Do NOT smoke after Midnight   Take these medicines the morning of surgery with A SIP OF WATER: synthroid,pantoprazole.Use inhalers as usual. Amlodipine  Bring CPAP mask and tubing  day of surgery.                              You may not have any metal on your body including hair pins, jewelry, and body piercing             Do not wear make-up, lotions, powders, perfumes/cologne, or deodorant  Do not wear nail polish including gel and S&S, artificial/acrylic nails, or any other type of covering on natural nails including finger and toenails. If you have artificial nails, gel coating, etc. that needs to be removed by a nail salon please have this removed prior to surgery or surgery may need to be canceled/ delayed if the surgeon/ anesthesia feels like they are unable to be safely monitored.   Do not shave  48 hours prior to surgery.    Do not bring valuables to the hospital. Center.   Contacts, glasses, or bridgework may not be worn into surgery.   Bring small overnight bag day of surgery.   DO NOT [place]. PHARMACY WILL DISPENSE MEDICATIONS LISTED ON YOUR MEDICATION LIST TO YOU DURING YOUR ADMISSION [place]!    Patients discharged on the day of surgery will not be allowed to drive home.  Someone NEEDS to stay with you for the first 24 hours after anesthesia.   Special Instructions: Bring a copy of your healthcare power of attorney and living will documents         the day of surgery if you haven't scanned them before.              Please read over the following fact sheets you were given: IF YOU HAVE QUESTIONS ABOUT YOUR PRE-OP INSTRUCTIONS PLEASE CALL [phone]    [facility] - Preparing for Surgery Before  surgery, you can play an important role.  Because skin is not sterile, your skin needs to be as free of germs as possible.  You can reduce the number of germs on your skin by washing with CHG (chlorahexidine gluconate) soap before surgery.  CHG is an antiseptic cleaner which kills germs and bonds with the skin to continue killing germs even after washing. Please DO NOT use if you have an allergy to CHG or antibacterial soaps.  If your skin becomes reddened/irritated stop using the CHG and inform your nurse when you arrive at Short Stay. Do not shave (including legs and underarms) for at least 48 hours prior to the first CHG shower.  You may shave your face/neck. Please follow these instructions carefully:  1.  Shower with CHG Soap the night before surgery and the  morning of Surgery.  2.  If you choose to wash your hair, wash your hair first as usual with your  normal  shampoo.  3.  After you shampoo, rinse your hair and body thoroughly to remove the  shampoo.                           4.  Use CHG as you would any other liquid soap.  You can apply chg directly  to the skin and wash                       Gently with a scrungie  or clean washcloth.  5.  Apply the CHG Soap to your body ONLY FROM THE NECK DOWN.   Do not use on face/ open                           Wound or open sores. Avoid contact with eyes, ears mouth and genitals (private parts).                       Wash face,  Genitals (private parts) with your normal soap.             6.  Wash thoroughly, paying special attention to the area where your surgery  will be performed.  7.  Thoroughly rinse your body with warm water from the neck down.  8.  DO NOT shower/wash with your normal soap after using and rinsing off  the CHG Soap.                9.  Pat yourself dry with a clean towel.            10.  Wear clean pajamas.            11.  Place clean sheets on your bed the night of your first shower and do not  sleep with pets. Day of Surgery  : Do not apply any lotions/deodorants the morning of surgery.  Please wear clean clothes to the hospital/surgery center.  FAILURE TO FOLLOW THESE INSTRUCTIONS MAY RESULT IN THE CANCELLATION OF YOUR SURGERY PATIENT SIGNATURE_________________________________  NURSE SIGNATURE__________________________________  ________________________________________________________________________  Adam Phenix  An incentive spirometer is a tool that can help keep your lungs clear and active. This tool measures how well you are filling your lungs with each breath. Taking long deep breaths may help reverse or decrease the chance of developing breathing (pulmonary) problems (especially infection) following: A long period of time when you are unable to move or be active. BEFORE THE PROCEDURE  If the spirometer includes an indicator to show your best effort, your nurse or respiratory therapist will set it to a desired goal. If possible, sit up straight or lean slightly forward. Try not to slouch. Hold the incentive spirometer in an upright position. INSTRUCTIONS FOR USE  Sit on the edge of your bed if possible, or sit up as far as you can in bed or on a chair. Hold the incentive spirometer in an upright position. Breathe out normally. Place the mouthpiece in your mouth and seal your lips tightly around it. Breathe in slowly and as deeply as possible, raising the piston or the ball toward the top of the column. Hold your breath for 3-5 seconds or for as long as possible. Allow the piston or ball to fall to the bottom of the column. Remove the mouthpiece from your mouth and breathe out normally. Rest for a few seconds and repeat Steps 1 through 7 at least 10 times every 1-2 hours when you are awake. Take your time and take a few normal breaths between deep breaths. The spirometer may include an indicator to show your best effort. Use the indicator as a goal to work toward during each repetition. After  each set of 10 deep breaths, practice coughing to be sure your lungs are clear. If you have an incision (the cut made at the time of surgery), support your incision when coughing by placing a pillow or rolled up towels firmly against it. Once you  are able to get out of bed, walk around indoors and cough well. You may stop using the incentive spirometer when instructed by your caregiver.  RISKS AND COMPLICATIONS Take your time so you do not get dizzy or light-headed. If you are in pain, you may need to take or ask for pain medication before doing incentive spirometry. It is harder to take a deep breath if you are having pain. AFTER USE Rest and breathe slowly and easily. It can be helpful to keep track of a log of your progress. Your caregiver can provide you with a simple table to help with this. If you are using the spirometer at home, follow these instructions: [place] IF:  You are having difficultly using the spirometer. You have trouble using the spirometer as often as instructed. Your pain medication is not giving enough relief while using the spirometer. You develop fever of 100.5 F (38.1 C) or higher. SEEK IMMEDIATE MEDICAL CARE IF:  You cough up bloody sputum that had not been present before. You develop fever of 102 F (38.9 C) or greater. You develop worsening pain at or near the incision site. MAKE SURE YOU:  Understand these instructions. Will watch your condition. Will get help right away if you are not doing well or get worse. Document Released: 06/26/2006 Document Revised: 05/08/2011 Document Reviewed: 08/27/2006 [facility] Patient Information 2014 [place], [place].   ________________________________________________________________________

## 2022-04-14 ENCOUNTER — Other Ambulatory Visit: Payer: Self-pay

## 2022-04-14 ENCOUNTER — Encounter (HOSPITAL_COMMUNITY): Payer: Self-pay

## 2022-04-14 ENCOUNTER — Encounter (HOSPITAL_COMMUNITY)
Admission: RE | Admit: 2022-04-14 | Discharge: 2022-04-14 | Disposition: A | Discharge status: Home / Self Care | Payer: Medicare Other | Source: Ambulatory Visit | Attending: Orthopedic Surgery | Admitting: Orthopedic Surgery

## 2022-04-14 VITALS — BP 125/76 | HR 97 | Temp 98.5°F | Resp 16 | Ht 68.5 in | Wt 268.0 lb

## 2022-04-14 DIAGNOSIS — Z01812 Encounter for preprocedural laboratory examination: Secondary | ICD-10-CM | POA: Insufficient documentation

## 2022-04-14 DIAGNOSIS — I10 Essential (primary) hypertension: Secondary | ICD-10-CM | POA: Insufficient documentation

## 2022-04-14 DIAGNOSIS — Z01818 Encounter for other preprocedural examination: Secondary | ICD-10-CM

## 2022-04-14 HISTORY — DX: Essential (primary) hypertension: I10

## 2022-04-14 HISTORY — DX: Depression, unspecified: F32.A

## 2022-04-14 HISTORY — DX: Personal history of urinary calculi: Z87.442

## 2022-04-14 HISTORY — DX: Anxiety disorder, unspecified: F41.9

## 2022-04-14 LAB — CBC
HCT: 43.9 % (ref 36.0–46.0)
Hemoglobin: 13.6 g/dL (ref 12.0–15.0)
MCH: 28.3 pg (ref 26.0–34.0)
MCHC: 31 g/dL (ref 30.0–36.0)
MCV: 91.5 fL (ref 80.0–100.0)
Platelets: 256 10*3/uL (ref 150–400)
RBC: 4.8 MIL/uL (ref 3.87–5.11)
RDW: 13.2 % (ref 11.5–15.5)
WBC: 9.4 10*3/uL (ref 4.0–10.5)
nRBC: 0 % (ref 0.0–0.2)

## 2022-04-14 LAB — BASIC METABOLIC PANEL
Anion gap: 8 (ref 5–15)
BUN: 9 mg/dL (ref 8–23)
CO2: 27 mmol/L (ref 22–32)
Calcium: 8.7 mg/dL — ABNORMAL LOW (ref 8.9–10.3)
Chloride: 105 mmol/L (ref 98–111)
Creatinine, Ser: 0.75 mg/dL (ref 0.44–1.00)
GFR, Estimated: >60 mL/min (ref >60)
Glucose, Bld: 90 mg/dL (ref 70–99)
Potassium: 4.1 mmol/L (ref 3.5–5.1)
Sodium: 140 mmol/L (ref 135–145)

## 2022-04-14 LAB — SURGICAL PCR SCREEN
MRSA, PCR: NEGATIVE
Staphylococcus aureus: NEGATIVE

## 2022-04-14 NOTE — Progress Notes (Addendum)
PCP - Dr. Gala Lewandowsky Cardiologist - no  PPM/ICD -  Device Orders -  Rep Notified -   Chest x-ray -  EKG - 02-2022 on chart   Stress Test -  ECHO -  Cardiac Cath -   Sleep Study -  CPAP - yes  Fasting Blood Sugar -  Checks Blood Sugar _____ times a day  Blood Thinner Instructions:N/A Aspirin Instructions:  ERAS Protcol - PRE-SURGERY Ensure    COVID vaccine -yes  Activity-- Able to complete ADl's without SOB or CP Anesthesia review: HTN OSA  Patient denies shortness of breath, fever, cough and chest pain at PAT appointment   All instructions explained to the patient, with a verbal understanding of the material. Patient agrees to go over the instructions while at home for a better understanding. Patient also instructed to self quarantine after being tested for COVID-19. The opportunity to ask questions was provided.

## 2022-04-21 ENCOUNTER — Other Ambulatory Visit: Payer: Self-pay

## 2022-04-21 ENCOUNTER — Encounter (HOSPITAL_COMMUNITY)
Admission: RE | Disposition: A | Discharge status: Home / Self Care | Payer: Self-pay | Source: Ambulatory Visit | Attending: Orthopedic Surgery

## 2022-04-21 ENCOUNTER — Observation Stay (HOSPITAL_COMMUNITY)
Admission: RE | Admit: 2022-04-21 | Discharge: 2022-04-22 | Disposition: A | Discharge status: Home / Self Care | Payer: Medicare Other | Source: Ambulatory Visit | Attending: Orthopedic Surgery | Admitting: Orthopedic Surgery

## 2022-04-21 ENCOUNTER — Ambulatory Visit (HOSPITAL_BASED_OUTPATIENT_CLINIC_OR_DEPARTMENT_OTHER): Payer: Medicare Other | Admitting: Anesthesiology

## 2022-04-21 ENCOUNTER — Ambulatory Visit (HOSPITAL_COMMUNITY): Payer: Medicare Other | Admitting: Anesthesiology

## 2022-04-21 ENCOUNTER — Encounter (HOSPITAL_COMMUNITY): Payer: Self-pay | Admitting: Orthopedic Surgery

## 2022-04-21 DIAGNOSIS — J45909 Unspecified asthma, uncomplicated: Secondary | ICD-10-CM | POA: Diagnosis not present

## 2022-04-21 DIAGNOSIS — I10 Essential (primary) hypertension: Secondary | ICD-10-CM

## 2022-04-21 DIAGNOSIS — Z79899 Other long term (current) drug therapy: Secondary | ICD-10-CM | POA: Diagnosis not present

## 2022-04-21 DIAGNOSIS — M1711 Unilateral primary osteoarthritis, right knee: Principal | ICD-10-CM | POA: Insufficient documentation

## 2022-04-21 DIAGNOSIS — E039 Hypothyroidism, unspecified: Secondary | ICD-10-CM | POA: Insufficient documentation

## 2022-04-21 DIAGNOSIS — Z96651 Presence of right artificial knee joint: Secondary | ICD-10-CM

## 2022-04-21 DIAGNOSIS — F418 Other specified anxiety disorders: Secondary | ICD-10-CM | POA: Diagnosis not present

## 2022-04-21 DIAGNOSIS — G4730 Sleep apnea, unspecified: Secondary | ICD-10-CM | POA: Diagnosis not present

## 2022-04-21 HISTORY — PX: TOTAL KNEE ARTHROPLASTY: SHX125

## 2022-04-21 SURGERY — ARTHROPLASTY, KNEE, TOTAL
Anesthesia: Spinal | Site: Knee | Laterality: Right

## 2022-04-21 MED ORDER — BUPIVACAINE-EPINEPHRINE 0.25% -1:200000 IJ SOLN
INTRAMUSCULAR | Status: DC | PRN
  Administered 2022-04-21: 30 mL

## 2022-04-21 MED ORDER — WATER FOR IRRIGATION, STERILE IR SOLN
Status: DC | PRN
  Administered 2022-04-21: 2000 mL

## 2022-04-21 MED ORDER — ONDANSETRON HCL 4 MG PO TABS: 4.0000 mg | ORAL_TABLET | Freq: Four times a day (QID) | ORAL | Status: DC | PRN

## 2022-04-21 MED ORDER — TRANEXAMIC ACID-NACL 1000-0.7 MG/100ML-% IV SOLN
1000.0000 mg | INTRAVENOUS | Status: AC
  Administered 2022-04-21: 1000 mg via INTRAVENOUS
  Filled 2022-04-21: qty 100

## 2022-04-21 MED ORDER — MIDAZOLAM HCL 2 MG/2ML IJ SOLN
INTRAMUSCULAR | Status: AC
  Filled 2022-04-21: qty 2

## 2022-04-21 MED ORDER — ONDANSETRON HCL 4 MG/2ML IJ SOLN
INTRAMUSCULAR | Status: AC
  Filled 2022-04-21: qty 2

## 2022-04-21 MED ORDER — ORAL CARE MOUTH RINSE: 15.0000 mL | Freq: Once | OROMUCOSAL | Status: AC

## 2022-04-21 MED ORDER — ONDANSETRON HCL 4 MG/2ML IJ SOLN: 4.0000 mg | Freq: Once | INTRAMUSCULAR | Status: DC | PRN

## 2022-04-21 MED ORDER — POVIDONE-IODINE 10 % EX SWAB: 2.0000 | Freq: Once | CUTANEOUS | Status: DC

## 2022-04-21 MED ORDER — CEFAZOLIN IN SODIUM CHLORIDE 3-0.9 GM/100ML-% IV SOLN
3.0000 g | INTRAVENOUS | Status: AC
  Administered 2022-04-21: 3 g via INTRAVENOUS
  Filled 2022-04-21: qty 100

## 2022-04-21 MED ORDER — HYDROMORPHONE HCL 1 MG/ML IJ SOLN
INTRAMUSCULAR | Status: DC | PRN
  Administered 2022-04-21: 1 mg via INTRAVENOUS

## 2022-04-21 MED ORDER — METHOCARBAMOL 500 MG PO TABS: 500.0000 mg | ORAL_TABLET | Freq: Three times a day (TID) | ORAL | 1 refills | Status: DC | PRN

## 2022-04-21 MED ORDER — NA SULFATE-K SULFATE-MG SULF 17.5-3.13-1.6 GM/177ML PO SOLN: 1.0000 | ORAL | Status: DC

## 2022-04-21 MED ORDER — BISACODYL 10 MG RE SUPP: 10.0000 mg | Freq: Every day | RECTAL | Status: DC | PRN

## 2022-04-21 MED ORDER — FENTANYL CITRATE (PF) 100 MCG/2ML IJ SOLN
INTRAMUSCULAR | Status: AC
  Filled 2022-04-21: qty 2

## 2022-04-21 MED ORDER — LACTATED RINGERS IV SOLN: INTRAVENOUS | Status: DC

## 2022-04-21 MED ORDER — TRANEXAMIC ACID-NACL 1000-0.7 MG/100ML-% IV SOLN
1000.0000 mg | Freq: Once | INTRAVENOUS | Status: AC
  Administered 2022-04-21: 1000 mg via INTRAVENOUS
  Filled 2022-04-21: qty 100

## 2022-04-21 MED ORDER — ROCURONIUM BROMIDE 10 MG/ML (PF) SYRINGE
PREFILLED_SYRINGE | INTRAVENOUS | Status: AC
  Filled 2022-04-21: qty 10

## 2022-04-21 MED ORDER — PROPOFOL 10 MG/ML IV BOLUS
INTRAVENOUS | Status: DC | PRN
  Administered 2022-04-21: 100 mg via INTRAVENOUS

## 2022-04-21 MED ORDER — MIDAZOLAM HCL 5 MG/5ML IJ SOLN
INTRAMUSCULAR | Status: DC | PRN
  Administered 2022-04-21: 2 mg via INTRAVENOUS

## 2022-04-21 MED ORDER — BUPIVACAINE LIPOSOME 1.3 % IJ SUSP
INTRAMUSCULAR | Status: DC | PRN
  Administered 2022-04-21: 20 mL

## 2022-04-21 MED ORDER — DEXAMETHASONE SODIUM PHOSPHATE 10 MG/ML IJ SOLN
INTRAMUSCULAR | Status: DC | PRN
  Administered 2022-04-21: 10 mg via INTRAVENOUS

## 2022-04-21 MED ORDER — ACETAMINOPHEN 10 MG/ML IV SOLN
1000.0000 mg | Freq: Once | INTRAVENOUS | Status: DC | PRN
  Administered 2022-04-21: 1000 mg via INTRAVENOUS

## 2022-04-21 MED ORDER — AMLODIPINE BESYLATE 5 MG PO TABS
2.5000 mg | ORAL_TABLET | Freq: Every day | ORAL | Status: DC
  Administered 2022-04-22: 2.5 mg via ORAL
  Filled 2022-04-21 (×2): qty 1

## 2022-04-21 MED ORDER — LIDOCAINE HCL (PF) 2 % IJ SOLN
INTRAMUSCULAR | Status: DC | PRN
  Administered 2022-04-21: 3 mL via INTRADERMAL

## 2022-04-21 MED ORDER — OXYCODONE HCL 5 MG/5ML PO SOLN: 5.0000 mg | Freq: Once | ORAL | Status: DC | PRN

## 2022-04-21 MED ORDER — ROCURONIUM BROMIDE 100 MG/10ML IV SOLN
INTRAVENOUS | Status: DC | PRN
  Administered 2022-04-21: 70 mg via INTRAVENOUS

## 2022-04-21 MED ORDER — CEFAZOLIN SODIUM-DEXTROSE 2-4 GM/100ML-% IV SOLN
2.0000 g | Freq: Four times a day (QID) | INTRAVENOUS | Status: AC
  Administered 2022-04-21 (×2): 2 g via INTRAVENOUS
  Filled 2022-04-21 (×3): qty 100

## 2022-04-21 MED ORDER — BUPIVACAINE LIPOSOME 1.3 % IJ SUSP: 20.0000 mL | Freq: Once | INTRAMUSCULAR | Status: DC

## 2022-04-21 MED ORDER — METOCLOPRAMIDE HCL 5 MG/ML IJ SOLN: 5.0000 mg | Freq: Three times a day (TID) | INTRAMUSCULAR | Status: DC | PRN

## 2022-04-21 MED ORDER — DEXAMETHASONE SODIUM PHOSPHATE 10 MG/ML IJ SOLN
INTRAMUSCULAR | Status: AC
  Filled 2022-04-21: qty 1

## 2022-04-21 MED ORDER — ONDANSETRON HCL 4 MG PO TABS: 4.0000 mg | ORAL_TABLET | Freq: Three times a day (TID) | ORAL | 1 refills | Status: DC | PRN

## 2022-04-21 MED ORDER — SUGAMMADEX SODIUM 500 MG/5ML IV SOLN
INTRAVENOUS | Status: AC
  Filled 2022-04-21: qty 5

## 2022-04-21 MED ORDER — PREDNISONE 10 MG (21) PO TBPK: ORAL_TABLET | Freq: Every day | ORAL | Status: DC

## 2022-04-21 MED ORDER — METHOCARBAMOL 500 MG PO TABS
500.0000 mg | ORAL_TABLET | Freq: Four times a day (QID) | ORAL | Status: DC | PRN
  Administered 2022-04-21 – 2022-04-22 (×3): 500 mg via ORAL
  Filled 2022-04-21 (×3): qty 1

## 2022-04-21 MED ORDER — HYDROMORPHONE HCL 1 MG/ML IJ SOLN
0.2500 mg | INTRAMUSCULAR | Status: DC | PRN
  Administered 2022-04-21 (×2): 0.5 mg via INTRAVENOUS

## 2022-04-21 MED ORDER — ASPIRIN 81 MG PO CHEW: 81.0000 mg | CHEWABLE_TABLET | Freq: Two times a day (BID) | ORAL | 0 refills | Status: AC

## 2022-04-21 MED ORDER — SUGAMMADEX SODIUM 500 MG/5ML IV SOLN
INTRAVENOUS | Status: DC | PRN
  Administered 2022-04-21: 243.2 mg via INTRAVENOUS

## 2022-04-21 MED ORDER — METHOCARBAMOL 500 MG IVPB - SIMPLE MED: 500.0000 mg | Freq: Four times a day (QID) | INTRAVENOUS | Status: DC | PRN

## 2022-04-21 MED ORDER — HYDROMORPHONE HCL 1 MG/ML IJ SOLN
INTRAMUSCULAR | Status: AC
  Filled 2022-04-21: qty 2

## 2022-04-21 MED ORDER — SODIUM CHLORIDE (PF) 0.9 % IJ SOLN
INTRAMUSCULAR | Status: DC | PRN
  Administered 2022-04-21: 30 mL

## 2022-04-21 MED ORDER — ONDANSETRON HCL 4 MG/2ML IJ SOLN: 4.0000 mg | Freq: Four times a day (QID) | INTRAMUSCULAR | Status: DC | PRN

## 2022-04-21 MED ORDER — POLYETHYLENE GLYCOL 3350 17 G PO PACK: 17.0000 g | PACK | Freq: Every day | ORAL | Status: DC | PRN

## 2022-04-21 MED ORDER — ACETAMINOPHEN 10 MG/ML IV SOLN
INTRAVENOUS | Status: AC
  Filled 2022-04-21: qty 100

## 2022-04-21 MED ORDER — ONDANSETRON HCL 4 MG/2ML IJ SOLN
INTRAMUSCULAR | Status: DC | PRN
  Administered 2022-04-21: 4 mg via INTRAVENOUS

## 2022-04-21 MED ORDER — SODIUM CHLORIDE 0.9 % IV SOLN: INTRAVENOUS | Status: DC

## 2022-04-21 MED ORDER — ACETAMINOPHEN 325 MG PO TABS: 325.0000 mg | ORAL_TABLET | Freq: Four times a day (QID) | ORAL | Status: DC | PRN

## 2022-04-21 MED ORDER — LIDOCAINE HCL (PF) 2 % IJ SOLN
INTRAMUSCULAR | Status: AC
  Filled 2022-04-21: qty 5

## 2022-04-21 MED ORDER — PHENOL 1.4 % MT LIQD: 1.0000 | OROMUCOSAL | Status: DC | PRN

## 2022-04-21 MED ORDER — HYDROCORTISONE 2.5 % RE CREA: 1.0000 | TOPICAL_CREAM | Freq: Two times a day (BID) | RECTAL | Status: DC | PRN

## 2022-04-21 MED ORDER — KETAMINE HCL 50 MG/5ML IJ SOSY
PREFILLED_SYRINGE | INTRAMUSCULAR | Status: AC
  Filled 2022-04-21: qty 5

## 2022-04-21 MED ORDER — CHLORHEXIDINE GLUCONATE 0.12 % MT SOLN
15.0000 mL | Freq: Once | OROMUCOSAL | Status: AC
  Administered 2022-04-21: 15 mL via OROMUCOSAL

## 2022-04-21 MED ORDER — ALBUTEROL SULFATE (2.5 MG/3ML) 0.083% IN NEBU: 3.0000 mL | INHALATION_SOLUTION | Freq: Four times a day (QID) | RESPIRATORY_TRACT | Status: DC | PRN

## 2022-04-21 MED ORDER — 0.9 % SODIUM CHLORIDE (POUR BTL) OPTIME
TOPICAL | Status: DC | PRN
  Administered 2022-04-21: 1000 mL

## 2022-04-21 MED ORDER — DOCUSATE SODIUM 100 MG PO CAPS
100.0000 mg | ORAL_CAPSULE | Freq: Two times a day (BID) | ORAL | Status: DC
  Administered 2022-04-21 – 2022-04-22 (×2): 100 mg via ORAL
  Filled 2022-04-21 (×2): qty 1

## 2022-04-21 MED ORDER — SODIUM CHLORIDE 0.9 % IR SOLN
Status: DC | PRN
  Administered 2022-04-21: 1000 mL

## 2022-04-21 MED ORDER — OXYCODONE HCL 5 MG PO TABS: 5.0000 mg | ORAL_TABLET | Freq: Once | ORAL | Status: DC | PRN

## 2022-04-21 MED ORDER — OXYCODONE HCL 5 MG PO TABS
5.0000 mg | ORAL_TABLET | ORAL | Status: DC | PRN
  Administered 2022-04-21: 5 mg via ORAL
  Administered 2022-04-21 – 2022-04-22 (×3): 10 mg via ORAL
  Filled 2022-04-21 (×3): qty 2
  Filled 2022-04-21: qty 1

## 2022-04-21 MED ORDER — ASPIRIN 81 MG PO CHEW
81.0000 mg | CHEWABLE_TABLET | Freq: Two times a day (BID) | ORAL | Status: DC
  Administered 2022-04-21 – 2022-04-22 (×2): 81 mg via ORAL
  Filled 2022-04-21 (×2): qty 1

## 2022-04-21 MED ORDER — PROPOFOL 1000 MG/100ML IV EMUL
INTRAVENOUS | Status: AC
  Filled 2022-04-21: qty 100

## 2022-04-21 MED ORDER — KETAMINE HCL 10 MG/ML IJ SOLN
INTRAMUSCULAR | Status: DC | PRN
  Administered 2022-04-21: 40 mg via INTRAVENOUS
  Administered 2022-04-21: 10 mg via INTRAVENOUS

## 2022-04-21 MED ORDER — HYDROMORPHONE HCL 1 MG/ML IJ SOLN: 0.5000 mg | INTRAMUSCULAR | Status: DC | PRN

## 2022-04-21 MED ORDER — BUPIVACAINE-EPINEPHRINE (PF) 0.25% -1:200000 IJ SOLN
INTRAMUSCULAR | Status: AC
  Filled 2022-04-21: qty 30

## 2022-04-21 MED ORDER — MENTHOL 3 MG MT LOZG: 1.0000 | LOZENGE | OROMUCOSAL | Status: DC | PRN

## 2022-04-21 MED ORDER — OXYCODONE-ACETAMINOPHEN 5-325 MG PO TABS: 1.0000 | ORAL_TABLET | ORAL | 0 refills | Status: DC | PRN

## 2022-04-21 MED ORDER — BUPIVACAINE LIPOSOME 1.3 % IJ SUSP
INTRAMUSCULAR | Status: AC
  Filled 2022-04-21: qty 20

## 2022-04-21 MED ORDER — FENTANYL CITRATE PF 50 MCG/ML IJ SOSY
50.0000 ug | PREFILLED_SYRINGE | INTRAMUSCULAR | Status: DC
  Administered 2022-04-21: 50 ug via INTRAVENOUS
  Filled 2022-04-21: qty 2

## 2022-04-21 MED ORDER — LEVOTHYROXINE SODIUM 100 MCG PO TABS
100.0000 ug | ORAL_TABLET | Freq: Every day | ORAL | Status: DC
  Administered 2022-04-22: 100 ug via ORAL
  Filled 2022-04-21: qty 1

## 2022-04-21 MED ORDER — HYDROMORPHONE HCL 2 MG/ML IJ SOLN
INTRAMUSCULAR | Status: AC
  Filled 2022-04-21: qty 1

## 2022-04-21 MED ORDER — LEVOTHYROXINE SODIUM 75 MCG PO TABS: 150.0000 ug | ORAL_TABLET | Freq: Every day | ORAL | Status: DC

## 2022-04-21 MED ORDER — METOCLOPRAMIDE HCL 5 MG PO TABS: 5.0000 mg | ORAL_TABLET | Freq: Three times a day (TID) | ORAL | Status: DC | PRN

## 2022-04-21 MED ORDER — ROPIVACAINE HCL 7.5 MG/ML IJ SOLN
INTRAMUSCULAR | Status: DC | PRN
  Administered 2022-04-21: 20 mL via PERINEURAL

## 2022-04-21 MED ORDER — SODIUM CHLORIDE (PF) 0.9 % IJ SOLN
INTRAMUSCULAR | Status: AC
  Filled 2022-04-21: qty 30

## 2022-04-21 MED ORDER — FENTANYL CITRATE (PF) 100 MCG/2ML IJ SOLN
INTRAMUSCULAR | Status: DC | PRN
  Administered 2022-04-21: 100 ug via INTRAVENOUS

## 2022-04-21 SURGICAL SUPPLY — 45 items
ATTUNE PS FEM RT SZ 5 CEM KNEE (Femur) IMPLANT
ATTUNE PSRP INSR SZ5 6 KNEE (Insert) IMPLANT
BASE TIBIA ATTUNE KNEE SYS SZ6 (Knees) IMPLANT
BLADE SAG 18X100X1.27 (BLADE) ×1 IMPLANT
BLADE SAW SGTL 13X75X1.27 (BLADE) ×1 IMPLANT
BNDG ELASTIC 6X10 VLCR STRL LF (GAUZE/BANDAGES/DRESSINGS) ×1 IMPLANT
BNDG GAUZE DERMACEA FLUFF 4 (GAUZE/BANDAGES/DRESSINGS) ×1 IMPLANT
BOWL SMART MIX CTS (DISPOSABLE) ×1 IMPLANT
CEMENT HV SMART SET (Cement) ×2 IMPLANT
COVER SURGICAL LIGHT HANDLE (MISCELLANEOUS) ×1 IMPLANT
CUFF TOURN SGL QUICK 34 (TOURNIQUET CUFF) ×1
CUFF TRNQT CYL 34X4.125X (TOURNIQUET CUFF) ×1 IMPLANT
DRAPE INCISE IOBAN 66X45 STRL (DRAPES) ×1 IMPLANT
DRAPE U-SHAPE 47X51 STRL (DRAPES) ×1 IMPLANT
DRSG ADAPTIC 3X8 NADH LF (GAUZE/BANDAGES/DRESSINGS) ×1 IMPLANT
DURAPREP 26ML APPLICATOR (WOUND CARE) ×1 IMPLANT
ELECT REM PT RETURN 15FT ADLT (MISCELLANEOUS) ×1 IMPLANT
GAUZE PAD ABD 8X10 STRL (GAUZE/BANDAGES/DRESSINGS) ×1 IMPLANT
GAUZE SPONGE 4X4 12PLY STRL (GAUZE/BANDAGES/DRESSINGS) ×1 IMPLANT
GLOVE BIOGEL PI IND STRL 7.5 (GLOVE) ×1 IMPLANT
GLOVE BIOGEL PI IND STRL 8.5 (GLOVE) ×1 IMPLANT
GLOVE ORTHO TXT STRL SZ7.5 (GLOVE) ×1 IMPLANT
GLOVE SURG ORTHO 8.5 STRL (GLOVE) ×2 IMPLANT
GOWN STRL REUS W/ TWL XL LVL3 (GOWN DISPOSABLE) ×2 IMPLANT
GOWN STRL REUS W/TWL XL LVL3 (GOWN DISPOSABLE) ×2
HANDPIECE INTERPULSE COAX TIP (DISPOSABLE) ×1
IMMOBILIZER KNEE 20 (SOFTGOODS) ×1
IMMOBILIZER KNEE 20 THIGH 36 (SOFTGOODS) IMPLANT
KIT TURNOVER KIT A (KITS) IMPLANT
MANIFOLD NEPTUNE II (INSTRUMENTS) ×1 IMPLANT
NS IRRIG 1000ML POUR BTL (IV SOLUTION) ×1 IMPLANT
PACK TOTAL KNEE CUSTOM (KITS) ×1 IMPLANT
PATELLA MEDIAL ATTUN 35MM KNEE (Knees) IMPLANT
PIN STEINMAN FIXATION KNEE (PIN) IMPLANT
PROTECTOR NERVE ULNAR (MISCELLANEOUS) ×1 IMPLANT
SET HNDPC FAN SPRY TIP SCT (DISPOSABLE) ×1 IMPLANT
STAPLER VISISTAT 35W (STAPLE) IMPLANT
STRIP CLOSURE SKIN 1/2X4 (GAUZE/BANDAGES/DRESSINGS) ×2 IMPLANT
SUT MNCRL AB 3-0 PS2 18 (SUTURE) ×1 IMPLANT
SUT VIC AB 0 CT1 36 (SUTURE) ×1 IMPLANT
SUT VIC AB 1 CT1 36 (SUTURE) ×3 IMPLANT
SUT VIC AB 2-0 CT1 27 (SUTURE) ×1
SUT VIC AB 2-0 CT1 TAPERPNT 27 (SUTURE) ×1 IMPLANT
TIBIA ATTUNE KNEE SYS BASE SZ6 (Knees) ×1 IMPLANT
WATER STERILE IRR 1000ML POUR (IV SOLUTION) ×2 IMPLANT

## 2022-04-21 NOTE — Progress Notes (Signed)
Orthopedic Tech Progress Note Patient Details:  Hannah Holt [id number] CPM was removed at approximately 5:25p.  CPM Right Knee CPM Right Knee: On Right Knee Flexion (Degrees): 90 Right Knee Extension (Degrees): 0  Post Interventions Patient Tolerated: Well  [name] [name] 04/21/2022, 5:32 PM

## 2022-04-21 NOTE — TOC Transition Note (Signed)
Transition of Care [facility]) - CM/SW Discharge Note  Patient Details  Name: Hannah Holt MRN: [id number] Date of Birth: 06/02/1950  Transition of Care [facility]) CM/SW Contact:  Sherie Don, LCSW Phone Number: 04/21/2022, 3:48 PM  Clinical Narrative: Patient is expected to discharge home after working with PT. CSW met with patient to review discharge plan and needs. Patient will go home with OPPT at [facility] in [place]. Patient will need a rolling walker as she only has a rollator at home. CSW made DME referral to [facility] with [employer], but the rolling walker cannot be delivered until an order for a rolling walker has been placed by the physician or PA. TOC signing off.    Final next level of care: OP Rehab Barriers to Discharge: No Barriers Identified  Patient Goals and CMS Choice CMS Medicare.gov Compare Post Acute Care list provided to:: Patient Choice offered to / list presented to : Patient  Discharge Plan and Services Additional resources added to the After Visit Summary for           DME Arranged: Walker rolling DME Agency: [employer] Date DME Agency Contacted: 04/21/22 Representative spoke with at DME Agency: [place] ([place]) Interventions SDOH Screenings   Food Insecurity: No Food Insecurity (04/21/2022)  Housing: Low Risk  (04/21/2022)  Transportation Needs: No Transportation Needs (04/21/2022)  Utilities: Not At Risk (04/21/2022)  Tobacco Use: Low Risk  (04/21/2022)   Readmission Risk Interventions     No data to display

## 2022-04-21 NOTE — Transfer of Care (Signed)
Immediate Anesthesia Transfer of Care Note  Patient: Hannah Holt  Procedure(s) Performed: TOTAL KNEE ARTHROPLASTY (Right: Knee)  Patient Location: PACU  Anesthesia Type:GA combined with regional for post-op pain  Level of Consciousness: drowsy and patient cooperative  Airway & Oxygen Therapy: Patient Spontanous Breathing and Patient connected to face mask oxygen  Post-op Assessment: Report given to RN and Post -op Vital signs reviewed and stable  Post vital signs: Reviewed and stable  Last Vitals:  Vitals Value Taken Time  BP 169/83 04/21/22 1200  Temp    Pulse 83 04/21/22 1202  Resp 20 04/21/22 1202  SpO2 97 % 04/21/22 1202  Vitals shown include unvalidated device data.  Last Pain:  Vitals:   04/21/22 0733  TempSrc: Oral  PainSc:       Patients Stated Pain Goal: 4 ([id number])  Complications: No notable events documented.

## 2022-04-21 NOTE — Evaluation (Signed)
Physical Therapy Evaluation Patient Details Name: Hannah Holt MRN: [id number] DOB: 1950-09-21 Today's Date: 04/21/2022  History of Present Illness  72 yo female presents to therapy s/p R TKA on 04/21/2022 due to failure of conservative measures. Pt PMH includes but is not limited to IBS, asthma, hep B, OSA, and hypothyroidism.  Clinical Impression  Hannah Holt is a 72 y.o. female POD 0 s/p R TKA. Patient reports mod I  with mobility at baseline. Patient is now limited by functional impairments (see PT problem list below) and requires min A for sit to supine and min guard for supine to sit with HOB elevated and bed rail  for bed mobility and min guard and cues for transfers. Patient was able to ambulate 75 feet with RW and min guard level of assist. Patient instructed in exercise to facilitate ROM and circulation to manage edema. Provided incentive spirometer and with Vcs pt able to achieve 1500 mL. Patient will benefit from continued skilled PT interventions to address impairments and progress towards PLOF. Acute PT will follow to progress mobility and stair training in preparation for safe discharge home.        Recommendations for follow up therapy are one component of a multi-disciplinary discharge planning process, led by the attending physician.  Recommendations may be updated based on patient status, additional functional criteria and insurance authorization.  Follow Up Recommendations        Assistance Recommended at Discharge Intermittent Supervision/Assistance  Patient can return home with the following  A little help with walking and/or transfers;A little help with bathing/dressing/bathroom;Assistance with cooking/housework;Direct supervision/assist for financial management;Assist for transportation    Equipment Recommendations Other (comment);Rolling walker (2 wheels)  Recommendations for Other Services       Functional Status Assessment Patient has had a recent decline  in their functional status and demonstrates the ability to make significant improvements in function in a reasonable and predictable amount of time.     Precautions / Restrictions Precautions Precautions: Knee;Fall;Other (comment) (pt desaturated to 86% on RA with activity, monitor O2) Restrictions Weight Bearing Restrictions: No      Mobility  Bed Mobility Overal bed mobility: Needs Assistance Bed Mobility: Supine to Sit, Sit to Supine     Supine to sit: Min guard Sit to supine: Min assist   General bed mobility comments: HOB elevated and use of R bed rail for supine to sit, pt required min A for R LE for sit to supine    Transfers Overall transfer level: Needs assistance Equipment used: Rolling walker (2 wheels) Transfers: Sit to/from Stand Sit to Stand: Min guard           General transfer comment: 2 attempts for STS from EOB with cues for proper UE placement poor carryover for use of B UEs for eccentric control to sitting surface    Ambulation/Gait Ambulation/Gait assistance: Min guard Gait Distance (Feet): 75 Feet Assistive device: Rolling walker (2 wheels) Gait Pattern/deviations: Step-to pattern, Knee flexed in stance - right Gait velocity: decreased        Stairs            Wheelchair Mobility    Modified Rankin (Stroke Patients Only)       Balance Overall balance assessment: Needs assistance Sitting-balance support: Feet supported Sitting balance-Leahy Scale: Fair     Standing balance support: Reliant on assistive device for balance, During functional activity (static stand no UE support) Standing balance-Leahy Scale: Fair  Pertinent Vitals/Pain Pain Assessment Pain Assessment: 0-10 Pain Score: 6  Pain Location: R knee and proximal LE Pain Descriptors / Indicators: Discomfort, Dull, Aching, Guarding Pain Intervention(s): Limited activity within patient's tolerance, Monitored during session,  Premedicated before session, Repositioned, Ice applied    Home Living Family/patient expects to be discharged to:: Private residence Living Arrangements: Spouse/significant other Available Help at Discharge: Family Type of Home: House Home Access: Stairs to enter Entrance Stairs-Rails: None Entrance Stairs-Number of Steps: 2 (through the garage and pt alternate enterance to the front with 5 steps wtih a handrail)   Home Layout: One level Home Equipment: BSC/3in1;Other (comment);Rollator (4 wheels) (needs the bucket portion of the [facility]) Additional Comments: pt indicates that she is not sure if her husband will be able to assist much when she d/c  home due to age and current health status    Prior Function Prior Level of Function : Independent/Modified Independent;Working/employed             Mobility Comments: pt reports sometimes having to use the rollator in the am and then is able to progress to no AD, pt is mod I with ADLs, self care tasks, IADLs, driving and working       Hand Dominance        Extremity/Trunk Assessment        Lower Extremity Assessment Lower Extremity Assessment: RLE deficits/detail RLE Deficits / Details: ankle DF/PF 4+/5 RLE Sensation: WNL       Communication   Communication: No difficulties  Cognition Arousal/Alertness: Awake/alert Behavior During Therapy: WFL for tasks assessed/performed Overall Cognitive Status: Within Functional Limits for tasks assessed                                          General Comments General comments (skin integrity, edema, etc.): Dr. Veverly Fells arrived during PT eval and provided pt ed on importance of R knee extension and use of CMP and bone foam while in bed due to pt R knee in flexion at all times prior to TKA    Exercises Total Joint Exercises Ankle Circles/Pumps: AROM, Both, 20 reps   Assessment/Plan    PT Assessment Patient needs continued PT services  PT Problem List Decreased  strength;Decreased range of motion;Decreased balance;Decreased activity tolerance;Decreased mobility;Decreased coordination;Decreased knowledge of use of DME;Pain       PT Treatment Interventions DME instruction;Gait training;Stair training;Functional mobility training;Therapeutic activities;Therapeutic exercise;Balance training;Neuromuscular re-education;Patient/family education;Modalities    PT Goals (Current goals can be found in the Care Plan section)  Acute Rehab PT Goals Patient Stated Goal: be able to walk and eventually be able to get up on my horse PT Goal Formulation: With patient Time For Goal Achievement: 05/05/22 Potential to Achieve Goals: Good    Frequency 7X/week     Co-evaluation               AM-PAC PT "6 Clicks" Mobility  Outcome Measure Help needed turning from your back to your side while in a flat bed without using bedrails?: A Little Help needed moving from lying on your back to sitting on the side of a flat bed without using bedrails?: A Little Help needed moving to and from a bed to a chair (including a wheelchair)?: A Little Help needed standing up from a chair using your arms (e.g., wheelchair or bedside chair)?: A Little Help needed to walk in hospital room?: A  Little Help needed climbing 3-5 steps with a railing? : Total 6 Click Score: 16    End of Session Equipment Utilized During Treatment: Gait belt Activity Tolerance: Patient limited by fatigue;Patient limited by pain Patient left: in bed;with call bell/phone within reach;with bed alarm set Nurse Communication: Mobility status;Other (comment) (pain report and desaturation on RA and nurse elected to have pt remain on RA when returned to bed) PT Visit Diagnosis: Unsteadiness on feet (R26.81);Other abnormalities of gait and mobility (R26.89);Muscle weakness (generalized) (M62.81);Difficulty in walking, not elsewhere classified (R26.2);Pain Pain - Right/Left: Right Pain - part of body: Knee;Leg     Time: [id number] PT Time Calculation (min) (ACUTE ONLY): 51 min   Charges:   PT Evaluation $PT Eval Low Complexity: 1 Low PT Treatments $Gait Training: 8-22 mins $Therapeutic Activity: 8-22 mins        Baird Lyons, PT   Adair Patter 04/21/2022, 6:56 PM

## 2022-04-21 NOTE — Op Note (Signed)
NAMEJENNEA, Hannah Holt MEDICAL RECORD NO: [id number] ACCOUNT NO: [id number] DATE OF BIRTH: Dec 30, 1950 FACILITY: [facility] LOCATION: [facility] PHYSICIAN: Doran Heater. Veverly Fells, MD  Operative Report   DATE OF PROCEDURE: 04/21/2022  PREOPERATIVE DIAGNOSIS:  Right knee end-stage arthritis.  POSTOPERATIVE DIAGNOSIS:  Right knee end-stage arthritis.  PROCEDURE PERFORMED:  Right total knee arthroplasty using DePuy Attune prosthesis.  ATTENDING SURGEON:  Doran Heater. Veverly Fells, MD  ASSISTANT:  Charletta Cousin [place], [place], who was scrubbed during entire procedure, and necessary for satisfactory completion of surgery.  ANESTHESIA:  General anesthesia plus adductor canal block was utilized plus local anesthesia.  ESTIMATED BLOOD LOSS:  Minimal.  FLUID REPLACEMENT:  1500 mL crystalloid.  COUNTS:  Instrument counts correct.  COMPLICATIONS:  No complications.  ANTIBIOTICS:  Perioperative antibiotics were given.  TOURNIQUET TIME:  70 minutes at 300 mmHg.  INDICATIONS:  The patient is a 72 year old female who presents with a history of worsening right knee pain due to end-stage arthritis.  The patient has failed an extended period of conservative management, desires operative treatment to relieve pain and  restore function for this arthritic knee.  Informed consent obtained.  DESCRIPTION OF PROCEDURE:  After an adequate level of anesthesia was achieved, the patient was positioned in a supine position.  Right leg correctly identified.  A nonsterile tourniquet placed on proximal thigh.  Right leg sterilely prepped and draped in  the usual manner.  Timeout called, verifying correct patient, correct site, we elevated the leg and exsanguinated with an Esmarch bandage.  We inflated the tourniquet to 300 mmHg.  We then placed the knee in flexion and performed a longitudinal midline  incision with a 10 blade scalpel.  We used a fresh 10 blade scalpel for the medial parapatellar arthrotomy.  We divided lateral  patellofemoral ligaments everting the patella and exposing the distal femur.  There was full thickness cartilage loss on both  medial and lateral femoral condyles.  We entered the distal femur with a step cut drill.  We then placed our intramedullary guide and resected 10 mm off the distal femur with the intramedullary guide, set on 5 degrees of valgus.  We then sized our femur  size 5 anterior down and performed anterior, posterior and chamfer cuts with the 4-in-1 block.  Next, we removed ACL and PCL meniscal tissues subluxing the tibia anteriorly.  We then cut our tibia, 90 degrees perpendicular to the long axis of tibia with  the external jig with minimal posterior slope, taking 3 mm off the affected medial and lateral sides.  Once we had our tibial cut done, we used a lamina spreader, removed posterior femoral condyle osteophytes with a 3/4 inch curved osteotome, we injected  the posterior capsule with a combination of Marcaine, Exparel and saline for postoperative pain control.  Next, we checked our gaps, which were symmetric at 5 mm.  We then did our modular drill and keel punch for the tibial preparation for the size 6  tibia.  Next, we went to the femur and did our box cut for the 5 right femur.  Once we had our trial components in place, we reduced the knee with a 5 mm poly spacer.  We were able to get full extension.  We had good stability both in flexion and  extension.  We then resurfaced the patella going from a 23 mm thickness down to a 15 mm thickness.  We drilled lug holes for the 35 patellar button.  We placed our trial button in place and  ranged the knee with excellent patellar tracking.  We had a  slight little bit of tilt, so I went ahead and released the lateral retinaculum with a lateral release.  Patellar tracking was perfect with no-touch technique.  After that, we trialled again and ranged the knee and had excellent stability in flexion and  extension.  We removed all trial  components.  We irrigated thoroughly, we vacuum mixed high viscosity cement on the back table.  We then after drying the bone well, we cemented the components into place, all in one step tibia, femur and patella, we had a  5 poly spacer trial in place and had the knee in extension compressing the bone while the cement set up and also had a patellar clamp holding the compression while the cement set up.  Once the cement was hard on the back table we injected the anterior  capsule with a combination of Marcaine, Exparel and saline.  We then went ahead and removed excess cement with 1/4 inch curved osteotome, did a final check and lavaged the knee joint and then selected the real size 5, 6 mm poly and placed on the tibial  tray, reduced the knee.  We had nice little pop as that medial condyle reduced.  Good stability, able to get full extension.  We irrigated thoroughly and then repaired the parapatellar arthrotomy with #1 Vicryl suture, followed by 2-0 and 0 layered  subcutaneous closure and 4-0 Monocryl for skin.  Steri-Strips applied followed by sterile dressing.  The patient tolerated surgery well.   SHY D: 04/21/2022 12:03:27 pm T: 04/21/2022 12:23:00 pm  JOB: [id number]

## 2022-04-21 NOTE — Interval H&P Note (Signed)
History and Physical Interval Note:  [id number] AM  Hannah Holt  has presented today for surgery, with the diagnosis of right knee osteoarthritis.  The various methods of treatment have been discussed with the patient and family. After consideration of risks, benefits and other options for treatment, the patient has consented to  Procedure(s) with comments: TOTAL KNEE ARTHROPLASTY (Right) - general, spinal as a surgical intervention.  The patient's history has been reviewed, patient examined, no change in status, stable for surgery.  I have reviewed the patient's chart and labs.  Questions were answered to the patient's satisfaction.     Augustin Schooling

## 2022-04-21 NOTE — Anesthesia Procedure Notes (Signed)
Procedure Name: Intubation Date/Time: 04/21/2022 10:15 AM  Performed by: Randye Lobo, CRNAPre-anesthesia Checklist: Patient identified, Emergency Drugs available, Suction available and Patient being monitored Patient Re-evaluated:Patient Re-evaluated prior to induction Oxygen Delivery Method: Circle System Utilized Preoxygenation: Pre-oxygenation with 100% oxygen Induction Type: IV induction Ventilation: Mask ventilation without difficulty Laryngoscope Size: Glidescope and 3 Grade View: Grade I Tube type: Oral Tube size: 7.0 mm Number of attempts: 1 Airway Equipment and Method: Stylet and Oral airway Placement Confirmation: ETT inserted through vocal cords under direct vision, positive ETCO2 and breath sounds checked- equal and bilateral Tube secured with: Tape Dental Injury: Teeth and Oropharynx as per pre-operative assessment

## 2022-04-21 NOTE — Anesthesia Procedure Notes (Signed)
Anesthesia Regional Block: Adductor canal block   Pre-Anesthetic Checklist: , timeout performed,  Correct Patient, Correct Site, Correct Laterality,  Correct Procedure, Correct Position, site marked,  Risks and benefits discussed,  Surgical consent,  Pre-op evaluation,  At surgeon's request and post-op pain management  Laterality: Right  Prep: chloraprep       Needles:  Injection technique: Single-shot  Needle Type: Echogenic Needle     Needle Length: 9cm      Additional Needles:   Procedures:,,,, ultrasound used (permanent image in chart),,    Narrative:  Start time: 04/21/2022 9:20 AM End time: 04/21/2022 9:26 AM Injection made incrementally with aspirations every 5 mL.  Performed by: Personally  Anesthesiologist: Myrtie Soman, MD  Additional Notes: Patient tolerated the procedure well without complications

## 2022-04-21 NOTE — Discharge Instructions (Signed)
Ice to the knee constantly.  Keep the incision covered and clean and dry for one week, then ok to get it wet in the shower.  Do exercise as instructed every hour, please to prevent stiffness.    DO NOT prop anything under the knee, it will make your knee stiff.  Prop under the ankle to encourage your knee to go straight.   Use the walker while you are up and around for balance.  Wear your support stockings 24/7 to prevent blood clots and take baby aspirin twice daily for 30 days also to prevent blood clots  Follow up with Dr Veverly Fells in two weeks in the office, call [phone] for appt  Call Dr Veverly Fells at [phone] for any questions or concerns

## 2022-04-21 NOTE — Progress Notes (Signed)
Orthopedic Tech Progress Note Patient Details:  Hannah Holt [id number]  CPM Right Knee CPM Right Knee: On Right Knee Flexion (Degrees): 90 Right Knee Extension (Degrees): 0  Post Interventions Patient Tolerated: Well Ortho Devices Type of Ortho Device: Bone foam zero knee Ortho Device/Splint Location: Right knee Ortho Device/Splint Interventions: Application   Post Interventions Patient Tolerated: Well  [name] Hannah Holt 04/21/2022, 1:27 PM

## 2022-04-21 NOTE — Anesthesia Procedure Notes (Signed)
Anesthesia Procedure Image    

## 2022-04-21 NOTE — Anesthesia Preprocedure Evaluation (Addendum)
Anesthesia Evaluation  Patient identified by MRN, date of birth, ID band Patient awake    Reviewed: Allergy & Precautions, H&P , NPO status , Patient's Chart, lab work & pertinent test results  Airway Mallampati: II  TM Distance: <3 FB Neck ROM: Full    Dental  (+) Upper Dentures, Dental Advisory Given   Pulmonary sleep apnea    Pulmonary exam normal breath sounds clear to auscultation       Cardiovascular hypertension, Normal cardiovascular exam Rhythm:Regular Rate:Normal     Neuro/Psych   Anxiety Depression    negative neurological ROS     GI/Hepatic negative GI ROS, Neg liver ROS,,,  Endo/Other  Hypothyroidism  Morbid obesity  Renal/GU negative Renal ROS  negative genitourinary   Musculoskeletal  (+) Arthritis , Osteoarthritis,    Abdominal   Peds negative pediatric ROS (+)  Hematology negative hematology ROS (+)   Anesthesia Other Findings   Reproductive/Obstetrics negative OB ROS                             Anesthesia Physical Anesthesia Plan  ASA: 3  Anesthesia Plan: General   Post-op Pain Management: Regional block*   Induction: Intravenous  PONV Risk Score and Plan: 2 and Ondansetron, Dexamethasone and Treatment may vary due to age or medical condition  Airway Management Planned: Oral ETT  Additional Equipment:   Intra-op Plan:   Post-operative Plan: Extubation in OR  Informed Consent: I have reviewed the patients History and Physical, chart, labs and discussed the procedure including the risks, benefits and alternatives for the proposed anesthesia with the patient or authorized representative who has indicated his/her understanding and acceptance.     Dental advisory given  Plan Discussed with: CRNA and Surgeon  Anesthesia Plan Comments: (Patient prefers general anesthesia)       Anesthesia Quick Evaluation

## 2022-04-21 NOTE — Brief Op Note (Signed)
04/21/2022  11:55 AM  PATIENT:  Hannah Holt  72 y.o. female  PRE-OPERATIVE DIAGNOSIS:  right knee osteoarthritis, end stage  POST-OPERATIVE DIAGNOSIS:  right knee osteoarthritis, end stage  PROCEDURE:  Procedure(s) with comments: TOTAL KNEE ARTHROPLASTY (Right) - general, spinal DePuy Attune  SURGEON:  Surgeon(s) and Role:    Netta Cedars, MD - Primary  PHYSICIAN ASSISTANT:   ASSISTANTS: [place] Bruns, PA-C   ANESTHESIA:   regional and general  EBL:  50 mL   BLOOD ADMINISTERED:none  DRAINS: none   LOCAL MEDICATIONS USED:  MARCAINE     SPECIMEN:  No Specimen  DISPOSITION OF SPECIMEN:  N/A  COUNTS:  YES  TOURNIQUET:   Total Tourniquet Time Documented: Thigh (Right) - 76 minutes Total: Thigh (Right) - 76 minutes   DICTATION: .Other Dictation: Dictation Number [id number]  PLAN OF CARE: Admit for overnight observation  PATIENT DISPOSITION:  PACU - hemodynamically stable.   Delay start of Pharmacological VTE agent (>24hrs) due to surgical blood loss or risk of bleeding: no

## 2022-04-22 DIAGNOSIS — M1711 Unilateral primary osteoarthritis, right knee: Secondary | ICD-10-CM | POA: Diagnosis not present

## 2022-04-22 LAB — CBC
HCT: 40.7 % (ref 36.0–46.0)
Hemoglobin: 12.5 g/dL (ref 12.0–15.0)
MCH: 28.4 pg (ref 26.0–34.0)
MCHC: 30.7 g/dL (ref 30.0–36.0)
MCV: 92.5 fL (ref 80.0–100.0)
Platelets: 242 10*3/uL (ref 150–400)
RBC: 4.4 MIL/uL (ref 3.87–5.11)
RDW: 13.2 % (ref 11.5–15.5)
WBC: 17.8 10*3/uL — ABNORMAL HIGH (ref 4.0–10.5)
nRBC: 0 % (ref 0.0–0.2)

## 2022-04-22 LAB — BASIC METABOLIC PANEL
Anion gap: 12 (ref 5–15)
BUN: 15 mg/dL (ref 8–23)
CO2: 25 mmol/L (ref 22–32)
Calcium: 8.6 mg/dL — ABNORMAL LOW (ref 8.9–10.3)
Chloride: 101 mmol/L (ref 98–111)
Creatinine, Ser: 0.99 mg/dL (ref 0.44–1.00)
GFR, Estimated: >60 mL/min (ref >60)
Glucose, Bld: 128 mg/dL — ABNORMAL HIGH (ref 70–99)
Potassium: 3.8 mmol/L (ref 3.5–5.1)
Sodium: 138 mmol/L (ref 135–145)

## 2022-04-22 NOTE — Progress Notes (Signed)
Orthopedics Progress Note  Subjective: Patient with some pain this AM. Walked down the hall yesterday  Objective:  Vitals:   04/21/22 2246 04/22/22 0210  BP: 117/67 123/72  Pulse: 65 61  Resp: 15 15  Temp: 97.8 F (36.6 C) 97.9 F (36.6 C)  SpO2: 95% 98%    General: Awake and alert  Musculoskeletal: Right knee dressing changed. Wound looks good. No pain with AROm ankle Neurovascularly intact  Lab Results  Component Value Date   WBC 17.8 (H) 04/22/2022   HGB 12.5 04/22/2022   HCT 40.7 04/22/2022   MCV 92.5 04/22/2022   PLT 242 04/22/2022       Component Value Date/Time   NA 138 04/22/2022 0426   K 3.8 04/22/2022 0426   CL 101 04/22/2022 0426   CO2 25 04/22/2022 0426   GLUCOSE 128 (H) 04/22/2022 0426   BUN 15 04/22/2022 0426   CREATININE 0.99 04/22/2022 0426   CALCIUM 8.6 (L) 04/22/2022 0426   GFRNONAA >60 04/22/2022 0426   GFRAA  05/17/2009 0939    >60        The eGFR has been calculated using the MDRD equation. This calculation has not been validated in all clinical situations. eGFR's persistently <60 mL/min signify possible Chronic Kidney Disease.    Lab Results  Component Value Date   INR 1.13 05/17/2009    Assessment/Plan: POD #1 s/p Procedure(s): TOTAL KNEE ARTHROPLASTY Stable this AM. POssible Discharge later today if cleared by PT.  She has minimal help at home.  Will need to be Independent with ADLs DVT prophylaxis with ASA and compression hose Follow up in two weeks  Doran Heater. Veverly Fells, MD 04/22/2022 6:48 AM

## 2022-04-22 NOTE — Plan of Care (Signed)
  Problem: Education: Goal: Knowledge of the prescribed therapeutic regimen will improve Outcome: Progressing Goal: Individualized Educational Video(s) Outcome: Progressing   Problem: Activity: Goal: Ability to avoid complications of mobility impairment will improve Outcome: Progressing Goal: Range of joint motion will improve Outcome: Progressing   Problem: Clinical Measurements: Goal: Postoperative complications will be avoided or minimized Outcome: Progressing   Problem: Pain Management: Goal: Pain level will decrease with appropriate interventions Outcome: Progressing   Problem: Skin Integrity: Goal: Will show signs of wound healing Outcome: Progressing   Problem: Education: Goal: Knowledge of General Education information will improve Description: Including pain rating scale, medication(s)/side effects and non-pharmacologic comfort measures Outcome: Progressing   Problem: Health Behavior/Discharge Planning: Goal: Ability to manage health-related needs will improve Outcome: Progressing   Problem: Clinical Measurements: Goal: Ability to maintain clinical measurements within normal limits will improve Outcome: Progressing Goal: Will remain free from infection Outcome: Progressing Goal: Diagnostic test results will improve Outcome: Progressing Goal: Respiratory complications will improve Outcome: Progressing Goal: Cardiovascular complication will be avoided Outcome: Progressing   Problem: Activity: Goal: Risk for activity intolerance will decrease Outcome: Progressing   Problem: Nutrition: Goal: Adequate nutrition will be maintained Outcome: Progressing   Problem: Coping: Goal: Level of anxiety will decrease Outcome: Progressing   Problem: Elimination: Goal: Will not experience complications related to bowel motility Outcome: Progressing Goal: Will not experience complications related to urinary retention Outcome: Progressing   Problem: Pain  Managment: Goal: General experience of comfort will improve Outcome: Progressing   Problem: Safety: Goal: Ability to remain free from injury will improve Outcome: Progressing   Problem: Skin Integrity: Goal: Risk for impaired skin integrity will decrease Outcome: Progressing   

## 2022-04-22 NOTE — Progress Notes (Signed)
Orthopedic Tech Progress Note Patient Details:  Hannah Holt [id number]  CPM on at 1100. Pt was unable to tolerate flexion greater than 75*, we tried 80*, but it was too painful for her at this time. C/o burning/nerve pain on lateral aspect of R thigh. Ice packs were applied to thigh which did help relieve some of her discomfort. All needs have been met at this time, call bell is within reach.  CPM Right Knee CPM Right Knee: On Right Knee Flexion (Degrees): 75 Right Knee Extension (Degrees): 0  Post Interventions Patient Tolerated: Fair, Well Instructions Provided: Care of device  Cortnie Ringel Jeri Modena 04/22/2022, 11:47 AM

## 2022-04-22 NOTE — Discharge Summary (Signed)
In most cases prophylactic antibiotics for Dental procdeures after total joint surgery are not necessary.  Exceptions are as follows:  1. History of prior total joint infection  2. Severely immunocompromised (Organ Transplant, cancer chemotherapy, Rheumatoid biologic meds such as [place])  3. Poorly controlled diabetes (A1C &gt; 8.0, blood glucose over 200)  If you have one of these conditions, contact your surgeon for an antibiotic prescription, prior to your dental procedure. Orthopedic Discharge Summary        Physician Discharge Summary  Patient ID: Hannah Holt MRN: [id number] DOB/AGE: 1950-09-28 72 y.o.  Admit date: 04/21/2022 Discharge date: 04/22/2022   Procedures:  Procedure(s) (LRB): TOTAL KNEE ARTHROPLASTY (Right)  Attending Physician:  Dr. Esmond Plants  Admission Diagnoses:   Right knee end stage OA  Discharge Diagnoses:  same   Past Medical History:  Diagnosis Date   Anxiety    Arthritis    Asthma    pt. denies   Depression    Hepatitis B    when she was 72 years old   History of kidney stones    Hypertension    Hypothyroidism    IBS (irritable bowel syndrome)    Sleep apnea    Sleep apnea     PCP: Gala Lewandowsky, MD   Discharged Condition: good  Hospital Course:  Patient underwent the above stated procedure on 04/21/2022. Patient tolerated the procedure well and brought to the recovery room in good condition and subsequently to the floor. Patient had an uncomplicated hospital course and was stable for discharge.   Disposition: Discharge disposition: 01-Home or Self Care      with follow up in 2 weeks    Follow-up Information     Netta Cedars, MD. Call in 2 week(s).   Specialty: Orthopedic Surgery Why: call [phone] for appt in two weeks Contact information: [address] [place] [address] [id number]                 Dental Antibiotics:  In most cases prophylactic antibiotics for  Dental procdeures after total joint surgery are not necessary.  Exceptions are as follows:  1. History of prior total joint infection  2. Severely immunocompromised (Organ Transplant, cancer chemotherapy, Rheumatoid biologic meds such as [place])  3. Poorly controlled diabetes (A1C &gt; 8.0, blood glucose over 200)  If you have one of these conditions, contact your surgeon for an antibiotic prescription, prior to your dental procedure.  Discharge Instructions     Call MD / Call 911   Complete by: As directed    If you experience chest pain or shortness of breath, CALL 911 and be transported to the hospital emergency room.  If you develope a fever above 101 F, pus (white drainage) or increased drainage or redness at the wound, or calf pain, call your surgeon's office.   Constipation Prevention   Complete by: As directed    Drink plenty of fluids.  Prune juice may be helpful.  You may use a stool softener, such as Colace (over the counter) 100 mg twice a day.  Use MiraLax (over the counter) for constipation as needed.   Diet - low sodium heart healthy   Complete by: As directed    Increase activity slowly as tolerated   Complete by: As directed    Post-operative opioid taper instructions:   Complete by: As directed    POST-OPERATIVE OPIOID TAPER INSTRUCTIONS: It is important to wean off of your opioid medication as soon as  possible. If you do not need pain medication after your surgery it is ok to stop day one. Opioids include: Codeine, Hydrocodone(Norco, Vicodin), Oxycodone(Percocet, oxycontin) and hydromorphone amongst others.  Long term and even short term use of opiods can cause: Increased pain response Dependence Constipation Depression Respiratory depression And more.  Withdrawal symptoms can include Flu like symptoms Nausea, vomiting And more Techniques to manage these symptoms Hydrate well Eat regular healthy meals Stay active Use relaxation techniques(deep  breathing, meditating, yoga) Do Not substitute Alcohol to help with tapering If you have been on opioids for less than two weeks and do not have pain than it is ok to stop all together.  Plan to wean off of opioids This plan should start within one week post op of your joint replacement. Maintain the same interval or time between taking each dose and first decrease the dose.  Cut the total daily intake of opioids by one tablet each day Next start to increase the time between doses. The last dose that should be eliminated is the evening dose.          Allergies as of 04/22/2022   No Known Allergies      Medication List     TAKE these medications    albuterol 108 (90 Base) MCG/ACT inhaler Commonly known as: VENTOLIN HFA Inhale 2 puffs into the lungs every 6 (six) hours as needed for wheezing or shortness of breath.   amLODipine 2.5 MG tablet Commonly known as: NORVASC Take 2.5 mg by mouth daily.   aspirin 81 MG chewable tablet Commonly known as: Aspirin Childrens Chew 1 tablet (81 mg total) by mouth 2 (two) times daily.   hydrocortisone 2.5 % rectal cream Commonly known as: ANUSOL-HC Place 1 application rectally 2 (two) times daily as needed for hemorrhoids or anal itching.   levothyroxine 100 MCG tablet Commonly known as: SYNTHROID Take 100 mcg by mouth daily before breakfast.   levothyroxine 150 MCG tablet Commonly known as: SYNTHROID TAKE 1 TABLET BY MOUTH EVERY DAY   methocarbamol 500 MG tablet Commonly known as: ROBAXIN Take 1 tablet (500 mg total) by mouth every 8 (eight) hours as needed for muscle spasms.   NON FORMULARY Pt uses a cpap nightly   ondansetron 4 MG tablet Commonly known as: Zofran Take 1 tablet (4 mg total) by mouth every 8 (eight) hours as needed for refractory nausea / vomiting, vomiting or nausea.   oxyCODONE-acetaminophen 5-325 MG tablet Commonly known as: Percocet Take 1-2 tablets by mouth every 4 (four) hours as needed for severe  pain.   predniSONE 10 MG (21) Tbpk tablet Commonly known as: STERAPRED UNI-PAK 21 TAB Take as directed   Suprep Bowel Prep Kit 17.5-3.13-1.6 GM/177ML Soln Generic drug: Na Sulfate-K Sulfate-Mg Sulf Take 1 kit by mouth as directed.          Signed: Augustin Schooling 04/22/2022, 6:50 AM  [facility] is now [facility] [address], [place], [place], [place] [address] Phone: [place]

## 2022-04-22 NOTE — Progress Notes (Signed)
Physical Therapy Treatment Patient Details Name: Hannah Holt MRN: [id number] DOB: [birth year] Today's Date: 04/22/2022   History of Present Illness 72 yo female presents to therapy s/p R TKA on 04/21/2022 due to failure of conservative measures. Pt PMH includes but is not limited to IBS, asthma, hep B, OSA, and hypothyroidism.    PT Comments    2nd session to practice stair negotiation and to discuss d/c home on today vs tomorrow.  Reviewed/practiced gait and stair training. Pt feels ready to d/c home on today. She has a friend coming to transport her home. Encouraged pt to ambulate often and to use her "bone foam" throughout the day as tolerated. All PT education completed.     Recommendations for follow up therapy are one component of a multi-disciplinary discharge planning process, led by the attending physician.  Recommendations may be updated based on patient status, additional functional criteria and insurance authorization.  Follow Up Recommendations  Follow physician's recommendations for discharge plan and follow up therapies     Assistance Recommended at Discharge Intermittent Supervision/Assistance  Patient can return home with the following A little help with walking and/or transfers;A little help with bathing/dressing/bathroom;Assistance with cooking/housework;Direct supervision/assist for financial management;Assist for transportation   Equipment Recommendations       Recommendations for Other Services       Precautions / Restrictions Precautions Precautions: Fall;Knee Restrictions Weight Bearing Restrictions: No RLE Weight Bearing: Weight bearing as tolerated     Mobility  Bed Mobility Overal bed mobility: Needs Assistance Bed Mobility: Supine to Sit     Supine to sit: Min guard, HOB elevated Sit to supine: Min guard, HOB elevated   General bed mobility comments: pt used gait belt as leg lifter. increased time. no assist required from therapist     Transfers Overall transfer level: Needs assistance Equipment used: Rolling walker (2 wheels) Transfers: Sit to/from Stand Sit to Stand: Min guard           General transfer comment: Min guard A. Cues for safety, technique, hand/LE placement. Increased time.    Ambulation/Gait Ambulation/Gait assistance: Min guard Gait Distance (Feet): 110 Feet Assistive device: Rolling walker (2 wheels) Gait Pattern/deviations: Step-to pattern, Antalgic, Decreased stance time - right, Knee flexed in stance - right       General Gait Details: Cues for safety, sequence. Min guard for safety. Slow gait speed.   Stairs Stairs: Yes Stairs assistance: Min guard Stair Management: One rail Left, Forwards, Step to pattern Number of Stairs: 2 General stair comments: up and over portable stairs x 1. cues for safety, sequencing. Used railing on 1 side to simulate pt using door frame on L. Pt has another entrance with 1 handrail but more stairs. She prefers to go up 2 stairs instead. She is unsure if her husband can safely stabilize walker for her (if she were to go up/down stairs with walker) so she prefers to do it on her own with him helping as/if needed.   Wheelchair Mobility    Modified Rankin (Stroke Patients Only)       Balance Overall balance assessment: Needs assistance         Standing balance support: During functional activity Standing balance-Leahy Scale: Fair                              Cognition Arousal/Alertness: Awake/alert Behavior During Therapy: WFL for tasks assessed/performed Overall Cognitive Status: Within Functional Limits for tasks assessed  Exercises     General Comments        Pertinent Vitals/Pain Pain Assessment Pain Assessment: 0-10 Pain Score: 6  Faces Pain Scale: Hurts even more Pain Location: R thigh Pain Descriptors / Indicators: Aching, Discomfort, Sore Pain  Intervention(s): Limited activity within patient's tolerance, Monitored during session, Ice applied, Repositioned    Home Living Family/patient expects to be discharged to:: Private residence Living Arrangements: Spouse/significant other Available Help at Discharge: Family Type of Home: House Home Access: Stairs to enter Entrance Stairs-Rails: None Entrance Stairs-Number of Steps: 2 (through the garage and pt alternate enterance to the front with 5 steps wtih a handrail)   Home Layout: One level Home Equipment: BSC/3in1;Other (comment);Rollator (4 wheels) (needs the bucket portion of the [facility]) Additional Comments: pt indicates that she is not sure if her husband will be able to assist much when she d/c  home due to age and current health status    Prior Function            PT Goals (current goals can now be found in the care plan section) Progress towards PT goals: Progressing toward goals    Frequency    7X/week      PT Plan Current plan remains appropriate    Co-evaluation              AM-PAC PT "6 Clicks" Mobility   Outcome Measure  Help needed turning from your back to your side while in a flat bed without using bedrails?: A Little Help needed moving from lying on your back to sitting on the side of a flat bed without using bedrails?: A Little Help needed moving to and from a bed to a chair (including a wheelchair)?: A Little Help needed standing up from a chair using your arms (e.g., wheelchair or bedside chair)?: A Little Help needed to walk in hospital room?: A Little Help needed climbing 3-5 steps with a railing? : A Little 6 Click Score: 18    End of Session Equipment Utilized During Treatment: Gait belt Activity Tolerance: Patient tolerated treatment well Patient left: with call bell/phone within reach   PT Visit Diagnosis: Other abnormalities of gait and mobility (R26.89) Pain - Right/Left: Right Pain - part of body: Knee     Time: 1410-1441 PT  Time Calculation (min) (ACUTE ONLY): 31 min  Charges:  $Gait Training: 23-37 mins                         Doreatha Massed, PT Acute Rehabilitation  Office: [phone]

## 2022-04-22 NOTE — Progress Notes (Addendum)
Physical Therapy Treatment Patient Details Name: Hannah Holt MRN: [id number] DOB: 1950-09-30 Today's Date: 04/22/2022   History of Present Illness 72 yo female presents to therapy s/p R TKA on 04/21/2022 due to failure of conservative measures. Pt PMH includes but is not limited to IBS, asthma, hep B, OSA, and hypothyroidism.    PT Comments    Progressing with mobility. O2 90% on RA during session on today. Will plan to have a 2nd session to practice stair negotiation. Pt is undecided if she feels ready to d/c home on today-will reassess after 2nd session. Placed OT consult order for ADL education since pt is stating she has limited assistance at home from her husband.     Recommendations for follow up therapy are one component of a multi-disciplinary discharge planning process, led by the attending physician.  Recommendations may be updated based on patient status, additional functional criteria and insurance authorization.  Follow Up Recommendations  Follow physician's recommendations for discharge plan and follow up therapies     Assistance Recommended at Discharge Intermittent Supervision/Assistance  Patient can return home with the following A little help with walking and/or transfers;A little help with bathing/dressing/bathroom;Assistance with cooking/housework;Direct supervision/assist for financial management;Assist for transportation   Equipment Recommendations  Rolling walker (2 wheels)    Recommendations for Other Services       Precautions / Restrictions Precautions Precautions: Fall;Knee Restrictions Weight Bearing Restrictions: No RLE Weight Bearing: Weight bearing as tolerated     Mobility  Bed Mobility Overal bed mobility: Needs Assistance Bed Mobility: Supine to Sit, Sit to Supine     Supine to sit: Min guard, HOB elevated Sit to supine: Min guard, HOB elevated   General bed mobility comments: Practiced using gait belt as leg lifter to A R LE on/off  bed. Increased time.    Transfers Overall transfer level: Needs assistance Equipment used: Rolling walker (2 wheels) Transfers: Sit to/from Stand Sit to Stand: Min assist           General transfer comment: Min A and increased time to rise from a low surface to simulate home environment. Cues for safety, technique, hand/LE placement.    Ambulation/Gait Ambulation/Gait assistance: Min guard Gait Distance (Feet): 110 Feet Assistive device: Rolling walker (2 wheels) Gait Pattern/deviations: Step-to pattern, Antalgic, Decreased stance time - right, Knee flexed in stance - right       General Gait Details: Cues for safety, sequence. Min guard for safety. Slow gait speed.   Stairs             Wheelchair Mobility    Modified Rankin (Stroke Patients Only)       Balance Overall balance assessment: Needs assistance         Standing balance support: Reliant on assistive device for balance, During functional activity, Bilateral upper extremity supported Standing balance-Leahy Scale: Fair                              Cognition Arousal/Alertness: Awake/alert Behavior During Therapy: WFL for tasks assessed/performed Overall Cognitive Status: Within Functional Limits for tasks assessed                                          Exercises Total Joint Exercises Ankle Circles/Pumps: AROM, Both, 10 reps Quad Sets: AROM, Both, 10 reps Heel Slides: AAROM, Right, 10 reps Hip  ABduction/ADduction: AAROM, Right, 10 reps Straight Leg Raises: AAROM, Right, 10 reps Goniometric ROM: ~10-65 degrees    General Comments        Pertinent Vitals/Pain Pain Assessment Pain Assessment: 0-10 Pain Score: 6  Pain Location: R thigh Pain Descriptors / Indicators: Aching, Discomfort, Sore Pain Intervention(s): Limited activity within patient's tolerance, Monitored during session, Ice applied, Repositioned    Home Living                           Prior Function            PT Goals (current goals can now be found in the care plan section) Progress towards PT goals: Progressing toward goals    Frequency    7X/week      PT Plan Current plan remains appropriate    Co-evaluation              AM-PAC PT "6 Clicks" Mobility   Outcome Measure  Help needed turning from your back to your side while in a flat bed without using bedrails?: A Little Help needed moving from lying on your back to sitting on the side of a flat bed without using bedrails?: A Little Help needed moving to and from a bed to a chair (including a wheelchair)?: A Little Help needed standing up from a chair using your arms (e.g., wheelchair or bedside chair)?: A Little Help needed to walk in hospital room?: A Little Help needed climbing 3-5 steps with a railing? : A Lot 6 Click Score: 17    End of Session Equipment Utilized During Treatment: Gait belt Activity Tolerance: Patient tolerated treatment well Patient left: in bed;with call bell/phone within reach;with bed alarm set   PT Visit Diagnosis: Other abnormalities of gait and mobility (R26.89);Pain Pain - Right/Left: Right Pain - part of body: Knee     Time: 1023-1105 PT Time Calculation (min) (ACUTE ONLY): 42 min  Charges:  $Gait Training: 23-37 mins $Therapeutic Exercise: 8-22 mins                        Doreatha Massed, PT Acute Rehabilitation  Office: [phone]

## 2022-04-22 NOTE — Progress Notes (Signed)
Pt discharged to home. DC instructions given. No concerns voiced. Pt encouraged to stop by [facility] and pick up meds that were e-prescribed by MD. Voiced understanding. Left unit in wheelchair pushed by this Probation officer. Left in stable condition. Pt carried away by Caucasian female in white SUV. Walker accompanied pt to home.

## 2022-04-22 NOTE — Evaluation (Signed)
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 Pt PMH includes but is not limited to IBS, asthma, hep B, OSA, and hypothyroidism.   Clinical Impression   Mrs. Isyss Cange is a 72 year old woman s/p R TKA who presents with decreased ROM and strength of RLE and pain. On evaluation she demonstrates modified independence with Adls except for donning shoes and socks. Therapist instructed on compensatory strategies. She does have a husband who can assist if needed though she doesn't like to ask him for help. She reports not wearing socks at home. Therapist did recommend a shoe horn and a reacher to make daily tasks easier. Patient verbalized understanding. Her o2 sat was 93%. She has no concerns in regards to discharge. No further OT needs at this time.       Recommendations for follow up therapy are one component of a multi-disciplinary discharge planning process, led by the attending physician.  Recommendations may be updated based on patient status, additional functional criteria and insurance authorization.   Follow Up Recommendations  No OT follow up     Assistance Recommended at Discharge PRN  Patient can return home with the following A little help with bathing/dressing/bathroom;Assistance with cooking/housework    Functional Status Assessment  Patient has had a recent decline in their functional status and demonstrates the ability to make significant improvements in function in a reasonable and predictable amount of time.  Equipment Recommendations  None recommended by OT    Recommendations for Other Services       Precautions / Restrictions Precautions Precautions: Fall;Knee Restrictions Weight Bearing Restrictions: No RLE Weight Bearing: Weight bearing as tolerated       Mobility Bed Mobility               General bed mobility comments: at edge of bed    Transfers Overall transfer level: Needs assistance Equipment used: Rolling walker (2 wheels) Transfers: Sit to/from Stand Sit to Stand: Supervision                  Balance Overall balance assessment: Needs assistance   Sitting balance-Leahy Scale: Good     Standing balance support: During functional activity Standing balance-Leahy Scale: Fair                             ADL either performed or assessed with clinical judgement   ADL                                         General ADL Comments: Overall modified independent except for donning socks and shoe. She will need to either ask for help from husband or purchase AE. therapist recommended shoe horn or grabber. Educated on compensatory strategies.     Vision Patient Visual Report: No change from baseline       Perception     Praxis      Pertinent Vitals/Pain Pain Assessment Pain Assessment: Faces Faces Pain Scale: Hurts even more Pain Location: R thigh Pain Descriptors / Indicators: Aching, Discomfort, Sore Pain Intervention(s): Limited activity within patient's tolerance     Hand Dominance Right   Extremity/Trunk Assessment Upper Extremity Assessment Upper Extremity Assessment:  Overall [facility] for tasks assessed   Lower Extremity Assessment Lower Extremity Assessment: Defer to PT evaluation   Cervical / Trunk Assessment Cervical / Trunk Assessment: Normal   Communication Communication Communication: No difficulties   Cognition Arousal/Alertness: Awake/alert Behavior During Therapy: WFL for tasks assessed/performed Overall Cognitive Status: Within Functional Limits for tasks assessed                                       General Comments       Exercises     Shoulder Instructions      Home Living Family/patient expects to be discharged to:: Private  residence Living Arrangements: Spouse/significant other Available Help at Discharge: Family Type of Home: House Home Access: Stairs to enter [employer] of Steps: 2 (through the garage and pt alternate enterance to the front with 5 steps wtih a handrail) Entrance Stairs-Rails: None Home Layout: One level     Bathroom Shower/Tub: Occupational psychologist: Handicapped height Bathroom Accessibility: Yes   Home Equipment: BSC/3in1;Other (comment);Rollator (4 wheels) (needs the bucket portion of the [facility])   Additional Comments: pt indicates that she is not sure if her husband will be able to assist much when she d/c  home due to age and current health status      Prior Functioning/Environment Prior Level of Function : Independent/Modified Independent;Working/employed             Mobility Comments: pt reports sometimes having to use the rollator in the am and then is able to progress to no AD, pt is mod I with ADLs, self care tasks, IADLs, driving and working          OT Problem List: Pain;Obesity      OT Treatment/Interventions:      OT Goals(Current goals can be found in the care plan section) Acute Rehab OT Goals OT Goal Formulation: All assessment and education complete, DC therapy  OT Frequency:      Co-evaluation              AM-PAC OT "6 Clicks" Daily Activity     Outcome Measure Help from another person eating meals?: None Help from another person taking care of personal grooming?: None Help from another person toileting, which includes using toliet, bedpan, or urinal?: None Help from another person bathing (including washing, rinsing, drying)?: None Help from another person to put on and taking off regular upper body clothing?: None Help from another person to put on and taking off regular lower body clothing?: A Little 6 Click Score: 23   End of Session Equipment Utilized During Treatment: Gait belt;Rolling walker (2 wheels) CPM Right  Knee CPM Right Knee: On Right Knee Flexion (Degrees): 75 Right Knee Extension (Degrees): 0 Nurse Communication: Mobility status  Activity Tolerance: Patient tolerated treatment well Patient left: in bed  OT Visit Diagnosis: Pain;Other abnormalities of gait and mobility (R26.89) Pain - Right/Left: Right Pain - part of body: Leg                Time: 1443-1459 OT Time Calculation (min): 16 min Charges:  OT General Charges $OT Visit: 1 Visit OT Evaluation $OT Eval Low Complexity: 1 Low  Gustavo Lah, OTR/L [place]  Office [phone]   Lenward Chancellor 04/22/2022, 3:22 PM

## 2022-04-22 NOTE — TOC Progression Note (Signed)
Transition of Care [facility]) - Progression Note   Patient Details  Name: Vitalia Machain MRN: [id number] Date of Birth: [birth year]  Transition of Care [facility]) CM/SW [place], LCSW Phone Number: 04/22/2022, 1:29 PM  Clinical Narrative: RW order has been placed. CSW updated Jasmine with [employer]. [employer] to deliver the walker to patient's room this afternoon.  Barriers to Discharge: No Barriers Identified  Expected Discharge Plan and Services Expected Discharge Date: 04/22/22               DME Arranged: Gilford Rile rolling DME Agency: [employer] Date DME Agency Contacted: 04/21/22 Representative spoke with at DME Agency: [place] ([place]) Interventions SDOH Screenings   Food Insecurity: No Food Insecurity (04/21/2022)  Housing: Low Risk  (04/21/2022)  Transportation Needs: No Transportation Needs (04/21/2022)  Utilities: Not At Risk (04/21/2022)  Tobacco Use: Low Risk  (04/21/2022)   Readmission Risk Interventions     No data to display

## 2022-04-24 ENCOUNTER — Encounter (HOSPITAL_COMMUNITY): Payer: Self-pay | Admitting: Orthopedic Surgery

## 2022-04-24 NOTE — Anesthesia Postprocedure Evaluation (Signed)
Anesthesia Post Note  Patient: Hannah Holt  Procedure(s) Performed: TOTAL KNEE ARTHROPLASTY (Right: Knee)     Patient location during evaluation: PACU Anesthesia Type: Spinal Level of consciousness: oriented and awake and alert Pain management: pain level controlled Vital Signs Assessment: post-procedure vital signs reviewed and stable Respiratory status: spontaneous breathing, respiratory function stable and patient connected to nasal cannula oxygen Cardiovascular status: blood pressure returned to baseline and stable Postop Assessment: no headache, no backache and no apparent nausea or vomiting Anesthetic complications: no  No notable events documented.  Last Vitals:  Vitals:   04/22/22 0648 04/22/22 1111  BP: (!) 126/95 (!) 112/57  Pulse: 65 65  Resp: 16 18  Temp: 36.9 C 36.8 C  SpO2: 96% 96%    Last Pain:  Vitals:   04/22/22 1433  TempSrc:   PainSc: 8                  Shonita Rinck S

## 2022-04-26 NOTE — TOC CM/SW Note (Signed)
TOC received voicemail from patient regarding PT being set up. Per chart review, patient was set up with OPPT prior to surgery. CSW attempted to call patient, but was notified patient is already at her [place] appointment.

## 2022-09-13 ENCOUNTER — Inpatient Hospital Stay: Payer: Medicare Other

## 2022-09-13 ENCOUNTER — Inpatient Hospital Stay: Payer: Medicare Other | Attending: Oncology | Admitting: Oncology

## 2022-09-13 ENCOUNTER — Encounter: Payer: Self-pay | Admitting: Oncology

## 2022-09-13 VITALS — BP 134/71 | HR 62 | Temp 97.7°F | Resp 16 | Ht 68.5 in | Wt 269.9 lb

## 2022-09-13 DIAGNOSIS — C50919 Malignant neoplasm of unspecified site of unspecified female breast: Secondary | ICD-10-CM | POA: Insufficient documentation

## 2022-09-13 DIAGNOSIS — C50412 Malignant neoplasm of upper-outer quadrant of left female breast: Secondary | ICD-10-CM | POA: Insufficient documentation

## 2022-09-13 DIAGNOSIS — Z170 Estrogen receptor positive status [ER+]: Secondary | ICD-10-CM

## 2022-09-13 DIAGNOSIS — Z9889 Other specified postprocedural states: Secondary | ICD-10-CM

## 2022-09-13 DIAGNOSIS — I429 Cardiomyopathy, unspecified: Secondary | ICD-10-CM

## 2022-09-13 DIAGNOSIS — I10 Essential (primary) hypertension: Secondary | ICD-10-CM | POA: Diagnosis not present

## 2022-09-13 DIAGNOSIS — Z8719 Personal history of other diseases of the digestive system: Secondary | ICD-10-CM | POA: Insufficient documentation

## 2022-09-13 DIAGNOSIS — I517 Cardiomegaly: Secondary | ICD-10-CM

## 2022-09-13 DIAGNOSIS — D126 Benign neoplasm of colon, unspecified: Secondary | ICD-10-CM

## 2022-09-13 LAB — CBC WITH DIFFERENTIAL/PLATELET
Abs Immature Granulocytes: 0.02 10*3/uL (ref 0.00–0.07)
Basophils Absolute: 0.1 10*3/uL (ref 0.0–0.1)
Basophils Relative: 1 %
Eosinophils Absolute: 0.5 10*3/uL (ref 0.0–0.5)
Eosinophils Relative: 6 %
HCT: 41.8 % (ref 36.0–46.0)
Hemoglobin: 12.7 g/dL (ref 12.0–15.0)
Immature Granulocytes: 0 %
Lymphocytes Relative: 23 %
Lymphs Abs: 2.1 10*3/uL (ref 0.7–4.0)
MCH: 27 pg (ref 26.0–34.0)
MCHC: 30.4 g/dL (ref 30.0–36.0)
MCV: 88.9 fL (ref 80.0–100.0)
Monocytes Absolute: 0.7 10*3/uL (ref 0.1–1.0)
Monocytes Relative: 8 %
Neutro Abs: 5.8 10*3/uL (ref 1.7–7.7)
Neutrophils Relative %: 62 %
Platelets: 266 10*3/uL (ref 150–400)
RBC: 4.7 MIL/uL (ref 3.87–5.11)
RDW: 14.6 % (ref 11.5–15.5)
WBC: 9.3 10*3/uL (ref 4.0–10.5)
nRBC: 0 % (ref 0.0–0.2)

## 2022-09-13 LAB — COMPREHENSIVE METABOLIC PANEL
ALT: 13 U/L (ref 0–44)
AST: 14 U/L — ABNORMAL LOW (ref 15–41)
Albumin: 3.9 g/dL (ref 3.5–5.0)
Alkaline Phosphatase: 108 U/L (ref 38–126)
Anion gap: 8 (ref 5–15)
BUN: 10 mg/dL (ref 8–23)
CO2: 28 mmol/L (ref 22–32)
Calcium: 8.9 mg/dL (ref 8.9–10.3)
Chloride: 103 mmol/L (ref 98–111)
Creatinine, Ser: 0.79 mg/dL (ref 0.44–1.00)
GFR, Estimated: >60 mL/min (ref >60)
Glucose, Bld: 89 mg/dL (ref 70–99)
Potassium: 4 mmol/L (ref 3.5–5.1)
Sodium: 139 mmol/L (ref 135–145)
Total Bilirubin: 0.7 mg/dL (ref 0.3–1.2)
Total Protein: 7.1 g/dL (ref 6.5–8.1)

## 2022-09-13 NOTE — Progress Notes (Signed)
[facility] Cancer Initial Visit:  Patient Care Team: Maris Berger, MD as PCP - General (Family Medicine)  CHIEF COMPLAINTS/PURPOSE OF CONSULTATION:  Oncology History  Breast cancer (HCC)  09/13/2022 Initial Diagnosis   Breast cancer (HCC)   09/13/2022 Cancer Staging   Staging form: Breast, AJCC 8th Edition - Clinical stage from 09/13/2022: Stage IB (cT2, cN0, cM0, G1, ER+, PR+, HER2-) - Signed by Loni Muse, MD on 09/18/2022 Histopathologic type: Lobular carcinoma, NOS Stage prefix: Initial diagnosis Histologic grading system: 3 grade system     HISTORY OF PRESENTING ILLNESS: Hannah Holt 72 y.o. female is here because of breast cancer Medical history notable for arthritis, hepatitis B, nephrolithiasis, retention, IBS, sleep apnea.  Right knee arthroplasty (February 2024)  Jul 03 2022: Bilateral screening mammogram-and left breast possible area of asymmetry warranting further evaluation Jul 20, 2022: Diagnostic left mammogram and breast ultrasound suspicious 1.6 cm shadowing mass in left breast at 2:00.  Ultrasound left breast at 2:00, 3 cm from nipple showed hypoechoic ill-defined taller than wide area 0.8 x 1.6 x 1.1 cm.  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  Additional portion of medial margin show fibrocystic changes, usual ductal hyperplasia and apocrine metaplasia but was negative for residual malignancy Sentinel node biopsy was not performed Pathologic stage:  Stage IB (T2)     September 13 2022:  [facility] Medical Oncology Consult Reviewed recent  history with patient.  Patient states that she did not note any breast abnormalities and that her prior mammogram was 2 years prior.  Patient has a history of an enlarged heart, which was diagnosed 16 years ago.   She has not been seen by a cardiologist since then.   Patient is G0 P0.  Menarche 72 years of age.  Hysterectomy at 72 years of age.  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  Tobacco none.  EtOH rare  [facility] Mother died 14 heart disease Father died 74 heart disease No siblings.       Review of Systems  Constitutional:  Positive for fatigue. Negative for appetite change, chills, fever and unexpected weight change.       Has lost 20 lbs due to change in eating habits  HENT:   Negative for lump/mass, mouth sores, nosebleeds, sore throat, tinnitus, trouble swallowing and voice change.   Eyes:  Negative for eye problems and icterus.       Vision changes:  None  Respiratory:  Negative for chest tightness, cough, hemoptysis and wheezing.        Develops SOB with activity when its hot  Cardiovascular:  Positive for leg swelling.       Experiences intermittent left sided chest pain lasting up to 5 minutes not related to activity which spontaneously resolves.  She attributes this to a "gas pain".  Legs swell at end of the day.  Has seldom episodes of palpitations  Gastrointestinal:  Negative for abdominal pain, blood in stool, diarrhea, nausea  and vomiting.       Chronic constipation  Endocrine:       Occasional hot flashes  Genitourinary:  Negative for bladder incontinence, difficulty urinating, dysuria, frequency, hematuria and nocturia.   Musculoskeletal:  Negative for arthralgias, back pain, gait problem, myalgias, neck pain and neck stiffness.  Skin:  Negative for itching, rash and wound.  Neurological:  Negative  for dizziness, extremity weakness, gait problem, headaches, light-headedness, numbness, seizures and speech difficulty.  Hematological:  Negative for adenopathy. Does not bruise/bleed easily.  Psychiatric/Behavioral:  Negative for suicidal ideas. The patient is not nervous/anxious.        Sleep 3 to 5 hrs per night    MEDICAL HISTORY: Past Medical History:  Diagnosis Date   Anxiety    Arthritis    Asthma    pt. denies   Depression    Hepatitis B    when she was 72 years old   History of kidney stones    Hypertension    Hypothyroidism    IBS (irritable bowel syndrome)    Sleep apnea    Sleep apnea     SURGICAL HISTORY: Past Surgical History:  Procedure Laterality Date   ANKLE SURGERY Left    bladder reattached     BREAST BIOPSY Left    BREAST LUMPECTOMY Left    CHOLECYSTECTOMY     COLONOSCOPY  02/06/2014   Moderate predominantly left colonic diverticulosis. Internal hemorrhoids. Limited examination due to quality.    KNEE SURGERY Bilateral    arthroscopic   KYPHOPLASTY     LITHOTRIPSY     TOTAL ABDOMINAL HYSTERECTOMY     TOTAL KNEE ARTHROPLASTY Right 04/21/2022   Procedure: TOTAL KNEE ARTHROPLASTY;  Surgeon: Beverely Low, MD;  Location: [facility];  Service: Orthopedics;  Laterality: Right;  general, spinal    SOCIAL HISTORY: Social History   Socioeconomic History   Marital status: Married    Spouse name: Not on file   Number of children: Not on file   Years of education: Not on file   Highest education level: Not on file  Occupational History   Not on file  Tobacco Use   Smoking status: Never   Smokeless tobacco: Never  Vaping Use   Vaping status: Never Used  Substance and Sexual Activity   Alcohol use: Yes    Comment: socially  holidays   Drug use: Never   Sexual activity: Not Currently  Other Topics Concern   Not on file  Social History Narrative   Not on file   Social Determinants of Health   Financial Resource Strain: Not on file  Food  Insecurity: No Food Insecurity (04/21/2022)   Hunger Vital Sign    Worried About Running Out of Food in the Last Year: Never true    Ran Out of Food in the Last Year: Never true  Transportation Needs: No Transportation Needs (04/21/2022)   PRAPARE - Administrator, Civil Service (Medical): No    Lack of Transportation (Non-Medical): No  Physical Activity: Not on file  Stress: Not on file  Social Connections: Not on file  Intimate Partner Violence: Not At Risk (04/21/2022)   Humiliation, Afraid, Rape, and Kick questionnaire    Fear of Current or Ex-Partner: No    Emotionally Abused: No    Physically Abused: No    Sexually Abused: No    FAMILY HISTORY Family History  Problem Relation Age of Onset   Heart disease Mother    Heart disease Father  Thyroid disease Neg Hx    Colon cancer Neg Hx    Esophageal cancer Neg Hx    Rectal cancer Neg Hx     ALLERGIES:  has No Known Allergies.  MEDICATIONS:  Current Outpatient Medications  Medication Sig Dispense Refill   albuterol (VENTOLIN HFA) 108 (90 Base) MCG/ACT inhaler Inhale 2 puffs into the lungs every 6 (six) hours as needed for wheezing or shortness of breath.     amLODipine (NORVASC) 2.5 MG tablet Take 2.5 mg by mouth daily.     levothyroxine (SYNTHROID) 100 MCG tablet Take 100 mcg by mouth daily before breakfast.     NON FORMULARY Pt uses a cpap nightly     No current facility-administered medications for this visit.    PHYSICAL EXAMINATION:  ECOG PERFORMANCE STATUS: 0 - Asymptomatic   Vitals:   09/13/22 0855  BP: 134/71  Pulse: 62  Resp: 16  Temp: 97.7 F (36.5 C)  SpO2: 95%    Filed Weights   09/13/22 0855  Weight: 269 lb 14.4 oz (122.4 kg)     Physical Exam Vitals and nursing note reviewed.  Constitutional:      Appearance: Normal appearance. She is not toxic-appearing or diaphoretic.  HENT:     Head: Normocephalic and atraumatic.     Right Ear: External ear normal.     Left Ear:  External ear normal.     Nose: Nose normal. No congestion or rhinorrhea.  Eyes:     General: No scleral icterus.    Extraocular Movements: Extraocular movements intact.     Conjunctiva/sclera: Conjunctivae normal.     Pupils: Pupils are equal, round, and reactive to light.  Cardiovascular:     Rate and Rhythm: Normal rate.     Heart sounds: No murmur heard.    No friction rub. No gallop.  Pulmonary:     Effort: Pulmonary effort is normal. No respiratory distress.     Breath sounds: Normal breath sounds. No stridor. No wheezing.  Abdominal:     General: Bowel sounds are normal.     Palpations: Abdomen is soft.     Tenderness: There is no abdominal tenderness. There is no guarding or rebound.  Musculoskeletal:        General: No swelling, tenderness or deformity.     Cervical back: Normal range of motion and neck supple. No rigidity or tenderness.  Lymphadenopathy:     Head:     Right side of head: No submental, submandibular, tonsillar, preauricular, posterior auricular or occipital adenopathy.     Left side of head: No submental, submandibular, tonsillar, preauricular, posterior auricular or occipital adenopathy.     Cervical: No cervical adenopathy.     Right cervical: No superficial, deep or posterior cervical adenopathy.    Left cervical: No superficial, deep or posterior cervical adenopathy.     Upper Body:     Right upper body: No supraclavicular, axillary, pectoral or epitrochlear adenopathy.     Left upper body: No supraclavicular, axillary, pectoral or epitrochlear adenopathy.  Skin:    General: Skin is warm.     Coloration: Skin is not jaundiced.     Findings: No bruising.  Neurological:     General: No focal deficit present.     Mental Status: She is alert and oriented to person, place, and time.     Cranial Nerves: No cranial nerve deficit.     Motor: No weakness.  Psychiatric:        Mood and Affect:  Mood normal.        Behavior: Behavior normal.        Thought  Content: Thought content normal.        Judgment: Judgment normal.     LABORATORY DATA: I have personally reviewed the data as listed:  Appointment on 09/13/2022  Component Date Value Ref Range Status   CA 15-3 09/13/2022 11.8  0.0 - 25.0 U/mL Final   Comment: (NOTE) Roche Diagnostics Electrochemiluminescence Immunoassay (ECLIA) Values obtained with different assay methods or kits cannot be used interchangeably.  Results cannot be interpreted as absolute evidence of the presence or absence of malignant disease. Performed At: [facility] [address] [place], [place] [id number] Jolene Schimke MD WU:[id number]   Office Visit on 09/13/2022  Component Date Value Ref Range Status   WBC 09/13/2022 9.3  4.0 - 10.5 K/uL Final   RBC 09/13/2022 4.70  3.87 - 5.11 MIL/uL Final   Hemoglobin 09/13/2022 12.7  12.0 - 15.0 g/dL Final   HCT 95/62/1308 41.8  36.0 - 46.0 % Final   MCV 09/13/2022 88.9  80.0 - 100.0 fL Final   MCH 09/13/2022 27.0  26.0 - 34.0 pg Final   MCHC 09/13/2022 30.4  30.0 - 36.0 g/dL Final   RDW 65/78/4696 14.6  11.5 - 15.5 % Final   Platelets 09/13/2022 266  150 - 400 K/uL Final   nRBC 09/13/2022 0.0  0.0 - 0.2 % Final   Neutrophils Relative % 09/13/2022 62  % Final   Neutro Abs 09/13/2022 5.8  1.7 - 7.7 K/uL Final   Lymphocytes Relative 09/13/2022 23  % Final   Lymphs Abs 09/13/2022 2.1  0.7 - 4.0 K/uL Final   Monocytes Relative 09/13/2022 8  % Final   Monocytes Absolute 09/13/2022 0.7  0.1 - 1.0 K/uL Final   Eosinophils Relative 09/13/2022 6  % Final   Eosinophils Absolute 09/13/2022 0.5  0.0 - 0.5 K/uL Final   Basophils Relative 09/13/2022 1  % Final   Basophils Absolute 09/13/2022 0.1  0.0 - 0.1 K/uL Final   Immature Granulocytes 09/13/2022 0  % Final   Abs Immature Granulocytes 09/13/2022 0.02  0.00 - 0.07 K/uL Final   Performed at [facility], [address] [place], [place] [address]   Sodium 09/13/2022 139  135 - 145 mmol/L  Final   Potassium 09/13/2022 4.0  3.5 - 5.1 mmol/L Final   Chloride 09/13/2022 103  98 - 111 mmol/L Final   CO2 09/13/2022 28  22 - 32 mmol/L Final   Glucose, Bld 09/13/2022 89  70 - 99 mg/dL Final   Glucose reference range applies only to samples taken after fasting for at least 8 hours.   BUN 09/13/2022 10  8 - 23 mg/dL Final   Creatinine, Ser 09/13/2022 0.79  0.44 - 1.00 mg/dL Final   Calcium 41/32/4401 8.9  8.9 - 10.3 mg/dL Final   Total Protein 02/72/5366 7.1  6.5 - 8.1 g/dL Final   Albumin 44/04/4740 3.9  3.5 - 5.0 g/dL Final   AST 59/56/3875 14 (L)  15 - 41 U/L Final   ALT 09/13/2022 13  0 - 44 U/L Final   Alkaline Phosphatase 09/13/2022 108  38 - 126 U/L Final   Total Bilirubin 09/13/2022 0.7  0.3 - 1.2 mg/dL Final   GFR, Estimated 09/13/2022 >60  >60 mL/min Final   Comment: (NOTE) Calculated using the CKD-EPI Creatinine Equation (2021)    Anion gap 09/13/2022 8  5 - 15  Final   Performed at [facility], [address] [place], [place] [address]    RADIOGRAPHIC STUDIES: I have personally reviewed the radiological images as listed and agree with the findings in the report  No results found.  ASSESSMENT/PLAN 72 y.o. female is here because of breast cancer.  Medical history notable for arthritis, hepatitis B, nephrolithiasis, retention, IBS, sleep apnea.  Right knee arthroplasty (February 2024)  Lobular carcinoma, left breast,  clinical Stage IB (T2 N0 M0) ER 95%/PR 95% HER2/neu negative/Ki-67 2%      Jul 03 2022: Bilateral screening mammogram- left breast  area of asymmetry warranting further evaluation Jul 20, 2022: Diagnostic left mammogram and breast ultrasound suspicious 1.6 cm shadowing mass in left breast at 2:00.  Ultrasound left breast at 2:00, 3 cm from nipple showed hypoechoic ill-defined taller than wide area 0.8 x 1.6 x 1.1 cm.  Ultrasound left axilla demonstrated normal lymph nodes   August 04, 2022: Core needle biopsy of left breast mass-  Invasive lobular carcinoma grade 1 along with lobular carcinoma in situ IHC ER 95%/PR 95% HER2/neu negative/Ki-67 2%    August 21, 2022: Lumpectomy without sentinel LN bx.  Invasive lobular carcinoma 28 mm in size, and lobular carcinoma in situ all margins negative.    Therapeutics:  For control of the breast patient underwent lumpectomy.  She is to be seen by Radiation oncology to discuss adjuvant  XRT.  Evaluation of the axillary lymph nodes occurred by U/S; a sentinel lymph node biopsy was not performed.  Given that the tumor is ER positive, hormone therapy will be offered.  We will obtain an Oncotype DX to assess the utility of chemotherapy for this patient.  History of colon polyps and new diagnosis of breast cancer  September 13 2022- Consider genetics referral  History of enlarged heart  September 13 2022- Refer to cardiology       Cancer Staging  Breast cancer [facility]) Staging form: Breast, AJCC 8th Edition - Clinical stage from 09/13/2022: Stage IB (cT2, cN0, cM0, G1, ER+, PR+, HER2-) - Signed by Loni Muse, MD on 09/18/2022 Histopathologic type: Lobular carcinoma, NOS Stage prefix: Initial diagnosis Histologic grading system: 3 grade system    No problem-specific Assessment & Plan notes found for this encounter.    Orders Placed This Encounter  Procedures   CBC with Differential/Platelet   Comprehensive metabolic panel   CA 15.3    Standing Status:   Future    Number of Occurrences:   1    Standing Expiration Date:   09/13/2023   Ambulatory referral to Cardiology    Referral Priority:   Urgent    Referral Type:   Consultation    Referral Reason:   Specialty Services Required    Number of Visits Requested:   1    60  minutes was spent in patient care.  This included time spent preparing to see the patient (e.g., review of tests), obtaining and/or reviewing separately obtained history, counseling and educating the patient/family/caregiver, ordering medications, tests, or  procedures; documenting clinical information in the electronic or other health record, independently interpreting results and communicating results to the patient/family/caregiver as well as coordination of care.       All questions were answered. The patient knows to call the clinic with any problems, questions or concerns.  This note was electronically signed.    Loni Muse, MD  09/18/2022 5:03 PM

## 2022-09-14 ENCOUNTER — Telehealth: Payer: Self-pay | Admitting: Oncology

## 2022-09-14 LAB — CANCER ANTIGEN 15-3: CA 15-3: 11.8 U/mL (ref 0.0–25.0)

## 2022-09-14 NOTE — Telephone Encounter (Signed)
09/14/22 Spoke with patient and confirmed next appt.

## 2022-09-18 DIAGNOSIS — I429 Cardiomyopathy, unspecified: Secondary | ICD-10-CM | POA: Insufficient documentation

## 2022-09-18 DIAGNOSIS — Z9889 Other specified postprocedural states: Secondary | ICD-10-CM | POA: Insufficient documentation

## 2022-09-18 DIAGNOSIS — K635 Polyp of colon: Secondary | ICD-10-CM | POA: Insufficient documentation

## 2022-10-04 ENCOUNTER — Inpatient Hospital Stay: Payer: Medicare Other | Admitting: Oncology

## 2022-10-04 NOTE — Progress Notes (Deleted)
[facility] Cancer Initial Visit:  Patient Care Team: Maris Berger, MD as PCP - General (Family Medicine)  CHIEF COMPLAINTS/PURPOSE OF CONSULTATION:  Oncology History  Breast cancer (HCC)  09/13/2022 Initial Diagnosis   Breast cancer (HCC)   09/13/2022 Cancer Staging   Staging form: Breast, AJCC 8th Edition - Clinical stage from 09/13/2022: Stage IB (cT2, cN0, cM0, G1, ER+, PR+, HER2-) - Signed by Loni Muse, MD on 09/18/2022 Histopathologic type: Lobular carcinoma, NOS Stage prefix: Initial diagnosis Histologic grading system: 3 grade system     HISTORY OF PRESENTING ILLNESS: Hannah Holt 72 y.o. female is here because of breast cancer Medical history notable for arthritis, hepatitis B, nephrolithiasis, retention, IBS, sleep apnea.  Right knee arthroplasty (February 2024)  Jul 03 2022: Bilateral screening mammogram-and left breast possible area of asymmetry warranting further evaluation Jul 20, 2022: Diagnostic left mammogram and breast ultrasound suspicious 1.6 cm shadowing mass in left breast at 2:00.  Ultrasound left breast at 2:00, 3 cm from nipple showed hypoechoic ill-defined taller than wide area 0.8 x 1.6 x 1.1 cm.  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  Additional portion of medial margin show fibrocystic changes, usual ductal hyperplasia and apocrine metaplasia but was negative for residual malignancy Sentinel node biopsy was not performed Pathologic stage:  Stage IB (T2)     September 13 2022:  [facility] Medical Oncology Consult Reviewed recent  history with patient.  Patient states that she did not note any breast abnormalities and that her prior mammogram was 2 years prior.  Patient has a history of an enlarged heart, which was diagnosed 16 years ago.   She has not been seen by a cardiologist since then.   Patient is G0 P0.  Menarche 72 years of age.  Hysterectomy at 72 years of age.  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  Tobacco none.  EtOH rare  [facility] Mother died 67 heart disease Father died 84 heart disease No siblings.       Review of Systems  Constitutional:  Positive for fatigue. Negative for appetite change, chills, fever and unexpected weight change.       Has lost 20 lbs due to change in eating habits  HENT:   Negative for lump/mass, mouth sores, nosebleeds, sore throat, tinnitus, trouble swallowing and voice change.   Eyes:  Negative for eye problems and icterus.       Vision changes:  None  Respiratory:  Negative for chest tightness, cough, hemoptysis and wheezing.        Develops SOB with activity when its hot  Cardiovascular:  Positive for leg swelling.       Experiences intermittent left sided chest pain lasting up to 5 minutes not related to activity which spontaneously resolves.  She attributes this to a "gas pain".  Legs swell at end of the day.  Has seldom episodes of palpitations  Gastrointestinal:  Negative for abdominal pain, blood in stool, diarrhea, nausea  and vomiting.       Chronic constipation  Endocrine:       Occasional hot flashes  Genitourinary:  Negative for bladder incontinence, difficulty urinating, dysuria, frequency, hematuria and nocturia.   Musculoskeletal:  Negative for arthralgias, back pain, gait problem, myalgias, neck pain and neck stiffness.  Skin:  Negative for itching, rash and wound.  Neurological:  Negative  for dizziness, extremity weakness, gait problem, headaches, light-headedness, numbness, seizures and speech difficulty.  Hematological:  Negative for adenopathy. Does not bruise/bleed easily.  Psychiatric/Behavioral:  Negative for suicidal ideas. The patient is not nervous/anxious.        Sleep 3 to 5 hrs per night    MEDICAL HISTORY: Past Medical History:  Diagnosis Date  . Anxiety   . Arthritis   . Asthma    pt. denies  . Depression   . Hepatitis B    when she was 72 years old  . History of kidney stones   . Hypertension   . Hypothyroidism   . IBS (irritable bowel syndrome)   . Sleep apnea   . Sleep apnea     SURGICAL HISTORY: Past Surgical History:  Procedure Laterality Date  . ANKLE SURGERY Left   . bladder reattached    . BREAST BIOPSY Left   . BREAST LUMPECTOMY Left   . CHOLECYSTECTOMY    . COLONOSCOPY  02/06/2014   Moderate predominantly left colonic diverticulosis. Internal hemorrhoids. Limited examination due to quality.   Marland Kitchen KNEE SURGERY Bilateral    arthroscopic  . KYPHOPLASTY    . LITHOTRIPSY    . TOTAL ABDOMINAL HYSTERECTOMY    . TOTAL KNEE ARTHROPLASTY Right 04/21/2022   Procedure: TOTAL KNEE ARTHROPLASTY;  Surgeon: Beverely Low, MD;  Location: [facility];  Service: Orthopedics;  Laterality: Right;  general, spinal    SOCIAL HISTORY: Social History   Socioeconomic History  . Marital status: Married    Spouse name: Not on file  . Number of children: Not on file  . Years of education: Not on file  . Highest education level: Not on file  Occupational History  . Not on file  Tobacco Use  . Smoking status: Never  . Smokeless tobacco: Never  Vaping Use  . Vaping status: Never Used  Substance and Sexual Activity  . Alcohol use: Yes    Comment: socially  holidays  . Drug use: Never  . Sexual activity: Not Currently  Other Topics Concern  . Not on file  Social History Narrative  . Not on file   Social Determinants of Health   Financial Resource  Strain: Not on file  Food Insecurity: No Food Insecurity (04/21/2022)   Hunger Vital Sign   . Worried About Programme researcher, broadcasting/film/video in the Last Year: Never true   . Ran Out of Food in the Last Year: Never true  Transportation Needs: No Transportation Needs (04/21/2022)   PRAPARE - Transportation   . Lack of Transportation (Medical): No   . Lack of Transportation (Non-Medical): No  Physical Activity: Not on file  Stress: Not on file  Social Connections: Not on file  Intimate Partner Violence: Not At Risk (04/21/2022)   Humiliation, Afraid, Rape, and Kick questionnaire   . Fear of Current or Ex-Partner: No   . Emotionally Abused: No   . Physically Abused: No   . Sexually Abused: No    FAMILY HISTORY Family History  Problem Relation Age of Onset  . Heart disease Mother   . Heart disease Father   .  Thyroid disease Neg Hx   . Colon cancer Neg Hx   . Esophageal cancer Neg Hx   . Rectal cancer Neg Hx     ALLERGIES:  has No Known Allergies.  MEDICATIONS:  Current Outpatient Medications  Medication Sig Dispense Refill  . albuterol (VENTOLIN HFA) 108 (90 Base) MCG/ACT inhaler Inhale 2 puffs into the lungs every 6 (six) hours as needed for wheezing or shortness of breath.    Marland Kitchen amLODipine (NORVASC) 2.5 MG tablet Take 2.5 mg by mouth daily.    Marland Kitchen levothyroxine (SYNTHROID) 100 MCG tablet Take 100 mcg by mouth daily before breakfast.    . NON FORMULARY Pt uses a cpap nightly     No current facility-administered medications for this visit.    PHYSICAL EXAMINATION:  ECOG PERFORMANCE STATUS: 0 - Asymptomatic   There were no vitals filed for this visit.   There were no vitals filed for this visit.    Physical Exam Vitals and nursing note reviewed.  Constitutional:      Appearance: Normal appearance. She is not toxic-appearing or diaphoretic.  HENT:     Head: Normocephalic and atraumatic.     Right Ear: External ear normal.     Left Ear: External ear normal.     Nose: Nose normal.  No congestion or rhinorrhea.  Eyes:     General: No scleral icterus.    Extraocular Movements: Extraocular movements intact.     Conjunctiva/sclera: Conjunctivae normal.     Pupils: Pupils are equal, round, and reactive to light.  Cardiovascular:     Rate and Rhythm: Normal rate.     Heart sounds: No murmur heard.    No friction rub. No gallop.  Pulmonary:     Effort: Pulmonary effort is normal. No respiratory distress.     Breath sounds: Normal breath sounds. No stridor. No wheezing.  Abdominal:     General: Bowel sounds are normal.     Palpations: Abdomen is soft.     Tenderness: There is no abdominal tenderness. There is no guarding or rebound.  Musculoskeletal:        General: No swelling, tenderness or deformity.     Cervical back: Normal range of motion and neck supple. No rigidity or tenderness.  Lymphadenopathy:     Head:     Right side of head: No submental, submandibular, tonsillar, preauricular, posterior auricular or occipital adenopathy.     Left side of head: No submental, submandibular, tonsillar, preauricular, posterior auricular or occipital adenopathy.     Cervical: No cervical adenopathy.     Right cervical: No superficial, deep or posterior cervical adenopathy.    Left cervical: No superficial, deep or posterior cervical adenopathy.     Upper Body:     Right upper body: No supraclavicular, axillary, pectoral or epitrochlear adenopathy.     Left upper body: No supraclavicular, axillary, pectoral or epitrochlear adenopathy.  Skin:    General: Skin is warm.     Coloration: Skin is not jaundiced.     Findings: No bruising.  Neurological:     General: No focal deficit present.     Mental Status: She is alert and oriented to person, place, and time.     Cranial Nerves: No cranial nerve deficit.     Motor: No weakness.  Psychiatric:        Mood and Affect: Mood normal.        Behavior: Behavior normal.        Thought Content: Thought content  normal.         Judgment: Judgment normal.    LABORATORY DATA: I have personally reviewed the data as listed:  Appointment on 09/13/2022  Component Date Value Ref Range Status  . CA 15-3 09/13/2022 11.8  0.0 - 25.0 U/mL Final   Comment: (NOTE) Roche Diagnostics Electrochemiluminescence Immunoassay (ECLIA) Values obtained with different assay methods or kits cannot be used interchangeably.  Results cannot be interpreted as absolute evidence of the presence or absence of malignant disease. Performed At: [facility] [address] [place], [place] [id number] Jolene Schimke MD QI:[id number]   Office Visit on 09/13/2022  Component Date Value Ref Range Status  . WBC 09/13/2022 9.3  4.0 - 10.5 K/uL Final  . RBC 09/13/2022 4.70  3.87 - 5.11 MIL/uL Final  . Hemoglobin 09/13/2022 12.7  12.0 - 15.0 g/dL Final  . HCT 32/44/0102 41.8  36.0 - 46.0 % Final  . MCV 09/13/2022 88.9  80.0 - 100.0 fL Final  . MCH 09/13/2022 27.0  26.0 - 34.0 pg Final  . MCHC 09/13/2022 30.4  30.0 - 36.0 g/dL Final  . RDW 72/53/6644 14.6  11.5 - 15.5 % Final  . Platelets 09/13/2022 266  150 - 400 K/uL Final  . nRBC 09/13/2022 0.0  0.0 - 0.2 % Final  . Neutrophils Relative % 09/13/2022 62  % Final  . Neutro Abs 09/13/2022 5.8  1.7 - 7.7 K/uL Final  . Lymphocytes Relative 09/13/2022 23  % Final  . Lymphs Abs 09/13/2022 2.1  0.7 - 4.0 K/uL Final  . Monocytes Relative 09/13/2022 8  % Final  . Monocytes Absolute 09/13/2022 0.7  0.1 - 1.0 K/uL Final  . Eosinophils Relative 09/13/2022 6  % Final  . Eosinophils Absolute 09/13/2022 0.5  0.0 - 0.5 K/uL Final  . Basophils Relative 09/13/2022 1  % Final  . Basophils Absolute 09/13/2022 0.1  0.0 - 0.1 K/uL Final  . Immature Granulocytes 09/13/2022 0  % Final  . Abs Immature Granulocytes 09/13/2022 0.02  0.00 - 0.07 K/uL Final   Performed at [facility], [address], [place], [place] [address]  . Sodium 09/13/2022 139  135 - 145 mmol/L Final  . Potassium  09/13/2022 4.0  3.5 - 5.1 mmol/L Final  . Chloride 09/13/2022 103  98 - 111 mmol/L Final  . CO2 09/13/2022 28  22 - 32 mmol/L Final  . Glucose, Bld 09/13/2022 89  70 - 99 mg/dL Final   Glucose reference range applies only to samples taken after fasting for at least 8 hours.  . BUN 09/13/2022 10  8 - 23 mg/dL Final  . Creatinine, Ser 09/13/2022 0.79  0.44 - 1.00 mg/dL Final  . Calcium 25/95/6387 8.9  8.9 - 10.3 mg/dL Final  . Total Protein 09/13/2022 7.1  6.5 - 8.1 g/dL Final  . Albumin 56/43/3295 3.9  3.5 - 5.0 g/dL Final  . AST 18/84/1660 14 (L)  15 - 41 U/L Final  . ALT 09/13/2022 13  0 - 44 U/L Final  . Alkaline Phosphatase 09/13/2022 108  38 - 126 U/L Final  . Total Bilirubin 09/13/2022 0.7  0.3 - 1.2 mg/dL Final  . GFR, Estimated 09/13/2022 >60  >60 mL/min Final   Comment: (NOTE) Calculated using the CKD-EPI Creatinine Equation (2021)   . Anion gap 09/13/2022 8  5 - 15 Final   Performed at [facility], [address], [place], [place] [address]    RADIOGRAPHIC STUDIES: I have personally  reviewed the radiological images as listed and agree with the findings in the report  No results found.  ASSESSMENT/PLAN 72 y.o. female is here because of breast cancer.  Medical history notable for arthritis, hepatitis B, nephrolithiasis, retention, IBS, sleep apnea.  Right knee arthroplasty (February 2024)  Lobular carcinoma, left breast,  clinical Stage IB (T2 N0 M0) ER 95%/PR 95% HER2/neu negative/Ki-67 2%      Jul 03 2022: Bilateral screening mammogram- left breast  area of asymmetry warranting further evaluation Jul 20, 2022: Diagnostic left mammogram and breast ultrasound suspicious 1.6 cm shadowing mass in left breast at 2:00.  Ultrasound left breast at 2:00, 3 cm from nipple showed hypoechoic ill-defined taller than wide area 0.8 x 1.6 x 1.1 cm.  Ultrasound left axilla demonstrated normal lymph nodes   August 04, 2022: Core needle biopsy of left breast mass- Invasive  lobular carcinoma grade 1 along with lobular carcinoma in situ IHC ER 95%/PR 95% HER2/neu negative/Ki-67 2%    August 21, 2022: Lumpectomy without sentinel LN bx.  Invasive lobular carcinoma 28 mm in size, and lobular carcinoma in situ all margins negative.    Therapeutics:  For control of the breast patient underwent lumpectomy.  She is to be seen by Radiation oncology to discuss adjuvant  XRT.  Evaluation of the axillary lymph nodes occurred by U/S; a sentinel lymph node biopsy was not performed.  Given that the tumor is ER positive, hormone therapy will be offered.  We will obtain an Oncotype DX to assess the utility of chemotherapy for this patient.  History of colon polyps and new diagnosis of breast cancer  September 13 2022- Consider genetics referral  History of enlarged heart  September 13 2022- Refer to cardiology       Cancer Staging  Breast cancer [facility]) Staging form: Breast, AJCC 8th Edition - Clinical stage from 09/13/2022: Stage IB (cT2, cN0, cM0, G1, ER+, PR+, HER2-) - Signed by Loni Muse, MD on 09/18/2022 Histopathologic type: Lobular carcinoma, NOS Stage prefix: Initial diagnosis Histologic grading system: 3 grade system    No problem-specific Assessment & Plan notes found for this encounter.    No orders of the defined types were placed in this encounter.   60  minutes was spent in patient care.  This included time spent preparing to see the patient (e.g., review of tests), obtaining and/or reviewing separately obtained history, counseling and educating the patient/family/caregiver, ordering medications, tests, or procedures; documenting clinical information in the electronic or other health record, independently interpreting results and communicating results to the patient/family/caregiver as well as coordination of care.       All questions were answered. The patient knows to call the clinic with any problems, questions or concerns.  This note was electronically  signed.    Loni Muse, MD  10/04/2022 8:44 AM

## 2022-10-06 ENCOUNTER — Ambulatory Visit: Payer: Medicare Other

## 2022-10-16 ENCOUNTER — Encounter: Payer: Self-pay | Admitting: Oncology

## 2022-10-16 ENCOUNTER — Telehealth: Payer: Self-pay | Admitting: Oncology

## 2022-10-16 ENCOUNTER — Inpatient Hospital Stay: Payer: Medicare Other | Attending: Oncology | Admitting: Oncology

## 2022-10-16 VITALS — BP 124/70 | HR 64 | Temp 97.5°F | Resp 18 | Ht 68.5 in

## 2022-10-16 DIAGNOSIS — C50412 Malignant neoplasm of upper-outer quadrant of left female breast: Secondary | ICD-10-CM

## 2022-10-16 DIAGNOSIS — R4589 Other symptoms and signs involving emotional state: Secondary | ICD-10-CM | POA: Diagnosis not present

## 2022-10-16 DIAGNOSIS — Z170 Estrogen receptor positive status [ER+]: Secondary | ICD-10-CM

## 2022-10-16 NOTE — Progress Notes (Unsigned)
[facility] Cancer Initial Visit:  Patient Care Team: Maris Berger, MD as PCP - General (Family Medicine)  CHIEF COMPLAINTS/PURPOSE OF CONSULTATION:  Oncology History  Breast cancer (HCC)  09/13/2022 Initial Diagnosis   Breast cancer (HCC)   09/13/2022 Cancer Staging   Staging form: Breast, AJCC 8th Edition - Clinical stage from 09/13/2022: Stage IB (cT2, cN0, cM0, G1, ER+, PR+, HER2-) - Signed by Loni Muse, MD on 09/18/2022 Histopathologic type: Lobular carcinoma, NOS Stage prefix: Initial diagnosis Histologic grading system: 3 grade system     HISTORY OF PRESENTING ILLNESS: Hannah Holt 72 y.o. female is here because of breast cancer Medical history notable for arthritis, hepatitis B, nephrolithiasis, retention, IBS, sleep apnea.  Right knee arthroplasty (February 2024)  Jul 03 2022: Bilateral screening mammogram-and left breast possible area of asymmetry warranting further evaluation Jul 20, 2022: Diagnostic left mammogram and breast ultrasound suspicious 1.6 cm shadowing mass in left breast at 2:00.  Ultrasound left breast at 2:00, 3 cm from nipple showed hypoechoic ill-defined taller than wide area 0.8 x 1.6 x 1.1 cm.  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  Additional portion of medial margin show fibrocystic changes, usual ductal hyperplasia and apocrine metaplasia but was negative for residual malignancy Sentinel node biopsy was not performed Pathologic stage:  Stage IB (T2)     September 13 2022:  [facility] Medical Oncology Consult Reviewed recent  history with patient.  Patient states that she did not note any breast abnormalities and that her prior mammogram was 2 years prior.  Patient has a history of an enlarged heart, which was diagnosed 16 years ago.   She has not been seen by a cardiologist since then.   Patient is G0 P0.  Menarche 72 years of age.  Hysterectomy at 72 years of age.  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  Tobacco none.  EtOH rare  [facility] Mother died 87 heart disease Father died 33 heart disease No siblings.    WBC 9.3 hemoglobin 12.7 platelet count 266; 60 segs 20 lymphs 8 monos 6 eos 1 basophil CA 15-3 11.8 CMP Unremarkable  October 16 2022:  Scheduled follow up.  Reviewed results of labs with patient Discussed endocrine therapy and XRT.  Addressed her concerns.  Fatigued with no energy.  Using CPAP consistently.  Was unable to keep appointment with cardiology due to inclement weather.  Wants to reschedule upon completion of XRT. Will hold on endocrine therapy until after XRT Oncotype DX RS 9 Distant recurrence risk at 9 years with endocrine therapy 3%  Benefit from chemotherapy < 1%    Review of Systems  Constitutional:  Positive for fatigue. Negative for appetite change, chills, fever and unexpected weight change.       Has lost 20 lbs due to change in eating habits  HENT:   Negative for lump/mass, mouth sores, nosebleeds, sore throat, tinnitus, trouble swallowing and voice change.   Eyes:  Negative for eye  problems and icterus.       Vision changes:  None  Respiratory:  Negative for chest tightness, cough, hemoptysis and wheezing.        Develops SOB with activity when its hot  Cardiovascular:  Positive for leg swelling.       Experiences intermittent left sided chest pain lasting up to 5 minutes not related to activity which  spontaneously resolves.  She attributes this to a "gas pain".  Legs swell at end of the day.  Has seldom episodes of palpitations  Gastrointestinal:  Negative for abdominal pain, blood in stool, diarrhea, nausea and vomiting.       Chronic constipation  Endocrine:       Occasional hot flashes  Genitourinary:  Negative for bladder incontinence, difficulty urinating, dysuria, frequency, hematuria and nocturia.   Musculoskeletal:  Negative for arthralgias, back pain, gait problem, myalgias, neck pain and neck stiffness.  Skin:  Negative for itching, rash and wound.  Neurological:  Negative for dizziness, extremity weakness, gait problem, headaches, light-headedness, numbness, seizures and speech difficulty.  Hematological:  Negative for adenopathy. Does not bruise/bleed easily.  Psychiatric/Behavioral:  Negative for suicidal ideas. The patient is not nervous/anxious.        Sleep 3 to 5 hrs per night    MEDICAL HISTORY: Past Medical History:  Diagnosis Date   Anxiety    Arthritis    Asthma    pt. denies   Depression    Hepatitis B    when she was 72 years old   History of kidney stones    Hypertension    Hypothyroidism    IBS (irritable bowel syndrome)    Sleep apnea    Sleep apnea     SURGICAL HISTORY: Past Surgical History:  Procedure Laterality Date   ANKLE SURGERY Left    bladder reattached     BREAST BIOPSY Left    BREAST LUMPECTOMY Left    CHOLECYSTECTOMY     COLONOSCOPY  02/06/2014   Moderate predominantly left colonic diverticulosis. Internal hemorrhoids. Limited examination due to quality.    KNEE SURGERY Bilateral    arthroscopic   KYPHOPLASTY     LITHOTRIPSY     TOTAL ABDOMINAL HYSTERECTOMY     TOTAL KNEE ARTHROPLASTY Right 04/21/2022   Procedure: TOTAL KNEE ARTHROPLASTY;  Surgeon: Beverely Low, MD;  Location: [facility];  Service: Orthopedics;  Laterality: Right;  general, spinal    SOCIAL HISTORY: Social History   Socioeconomic History   Marital status:  Married    Spouse name: Not on file   Number of children: Not on file   Years of education: Not on file   Highest education level: Not on file  Occupational History   Not on file  Tobacco Use   Smoking status: Never   Smokeless tobacco: Never  Vaping Use   Vaping status: Never Used  Substance and Sexual Activity   Alcohol use: Yes    Comment: socially  holidays   Drug use: Never   Sexual activity: Not Currently  Other Topics Concern   Not on file  Social History Narrative   Not on file   Social Determinants of Health   Financial Resource Strain: Not on file  Food Insecurity: No Food Insecurity (04/21/2022)   Hunger Vital Sign    Worried About Running Out of Food in the Last Year: Never true    Ran Out of Food in the Last Year: Never true  Transportation Needs: No Transportation Needs (04/21/2022)  PRAPARE - Administrator, Civil Service (Medical): No    Lack of Transportation (Non-Medical): No  Physical Activity: Not on file  Stress: Not on file  Social Connections: Not on file  Intimate Partner Violence: Not At Risk (04/21/2022)   Humiliation, Afraid, Rape, and Kick questionnaire    Fear of Current or Ex-Partner: No    Emotionally Abused: No    Physically Abused: No    Sexually Abused: No    FAMILY HISTORY Family History  Problem Relation Age of Onset   Heart disease Mother    Heart disease Father    Thyroid disease Neg Hx    Colon cancer Neg Hx    Esophageal cancer Neg Hx    Rectal cancer Neg Hx     ALLERGIES:  has No Known Allergies.  MEDICATIONS:  Current Outpatient Medications  Medication Sig Dispense Refill   albuterol (VENTOLIN HFA) 108 (90 Base) MCG/ACT inhaler Inhale 2 puffs into the lungs every 6 (six) hours as needed for wheezing or shortness of breath.     amLODipine (NORVASC) 2.5 MG tablet Take 2.5 mg by mouth daily.     levothyroxine (SYNTHROID) 100 MCG tablet Take 100 mcg by mouth daily before breakfast.     NON FORMULARY Pt uses  a cpap nightly     No current facility-administered medications for this visit.    PHYSICAL EXAMINATION:  ECOG PERFORMANCE STATUS: 0 - Asymptomatic   Vitals:   10/16/22 0832  BP: 124/70  Pulse: 64  Resp: 18  Temp: (!) 97.5 F (36.4 C)  SpO2: 96%     Filed Weights      Physical Exam Vitals and nursing note reviewed.  Constitutional:      Appearance: Normal appearance. She is not toxic-appearing or diaphoretic.  HENT:     Head: Normocephalic and atraumatic.     Right Ear: External ear normal.     Left Ear: External ear normal.     Nose: Nose normal. No congestion or rhinorrhea.  Eyes:     General: No scleral icterus.    Extraocular Movements: Extraocular movements intact.     Conjunctiva/sclera: Conjunctivae normal.     Pupils: Pupils are equal, round, and reactive to light.  Cardiovascular:     Rate and Rhythm: Normal rate.     Heart sounds: No murmur heard.    No friction rub. No gallop.  Pulmonary:     Effort: Pulmonary effort is normal. No respiratory distress.     Breath sounds: Normal breath sounds. No stridor. No wheezing.  Abdominal:     General: Bowel sounds are normal.     Palpations: Abdomen is soft.     Tenderness: There is no abdominal tenderness. There is no guarding or rebound.  Musculoskeletal:        General: No swelling, tenderness or deformity.     Cervical back: Normal range of motion and neck supple. No rigidity or tenderness.  Lymphadenopathy:     Head:     Right side of head: No submental, submandibular, tonsillar, preauricular, posterior auricular or occipital adenopathy.     Left side of head: No submental, submandibular, tonsillar, preauricular, posterior auricular or occipital adenopathy.     Cervical: No cervical adenopathy.     Right cervical: No superficial, deep or posterior cervical adenopathy.    Left cervical: No superficial, deep or posterior cervical adenopathy.     Upper Body:     Right upper body: No supraclavicular,  axillary, pectoral  or epitrochlear adenopathy.     Left upper body: No supraclavicular, axillary, pectoral or epitrochlear adenopathy.  Skin:    General: Skin is warm.     Coloration: Skin is not jaundiced.     Findings: No bruising.  Neurological:     General: No focal deficit present.     Mental Status: She is alert and oriented to person, place, and time.     Cranial Nerves: No cranial nerve deficit.     Motor: No weakness.  Psychiatric:        Mood and Affect: Mood normal.        Behavior: Behavior normal.        Thought Content: Thought content normal.        Judgment: Judgment normal.    LABORATORY DATA: I have personally reviewed the data as listed:  No visits with results within 1 Month(s) from this visit.  Latest known visit with results is:  Appointment on 09/13/2022  Component Date Value Ref Range Status   CA 15-3 09/13/2022 11.8  0.0 - 25.0 U/mL Final   Comment: (NOTE) Roche [employer]ostics Electrochemiluminescence Immunoassay (ECLIA) Values obtained with different assay methods or kits cannot be used interchangeably.  Results cannot be interpreted as absolute evidence of the presence or absence of malignant disease. Performed At: [facility] [address] [place], [place] [id number] Jolene Schimke MD AO:[id number]     RADIOGRAPHIC STUDIES: I have personally reviewed the radiological images as listed and agree with the findings in the report  No results found.  ASSESSMENT/PLAN 72 y.o. female is here because of breast cancer.  Medical history notable for arthritis, hepatitis B, nephrolithiasis, retention, IBS, sleep apnea.  Right knee arthroplasty (February 2024)  Lobular carcinoma, left breast,  clinical Stage IB (T2 N0 M0) ER 95%/PR 95% HER2/neu negative/Ki-67 2%/ Oncotype DX RS 9     Jul 03 2022: Bilateral screening mammogram- left breast  area of asymmetry warranting further evaluation Jul 20, 2022: Diagnostic left mammogram and breast ultrasound  suspicious 1.6 cm shadowing mass in left breast at 2:00.  Ultrasound left breast at 2:00, 3 cm from nipple showed hypoechoic ill-defined taller than wide area 0.8 x 1.6 x 1.1 cm.  Ultrasound left axilla demonstrated normal lymph nodes   August 04, 2022: Core needle biopsy of left breast mass- Invasive lobular carcinoma grade 1 along with lobular carcinoma in situ IHC ER 95%/PR 95% HER2/neu negative/Ki-67 2%    August 21, 2022: Lumpectomy without sentinel LN bx.  Invasive lobular carcinoma 28 mm in size, and lobular carcinoma in situ all margins negative.    Therapeutics:  For control of the breast patient underwent lumpectomy.  Evaluation of the axillary lymph nodes occurred by U/S; a sentinel lymph node biopsy was not performed.  Given that the tumor is ER positive, hormone therapy will be offered.   October 16 2022- Oncotype DX indicates no benefit from chemotherapy therefore recommended institution of adjuvant endocrine therapy.  Has been seen by Radiation oncology to discuss adjuvant  XRT.  Patient hesitant to proceed with either as she is concerned about potential side effects, particularly worsening fatigue.  Discussed the importance of adjuvant treatment in preventing recurrence.  At end of visit agreed to proceed with XRT and consider adjuvant hormonal therapy after radiation is complete  History of colon polyps and new diagnosis of breast cancer  September 13 2022- Consider genetics referral  October 17 2022- Will discuss after patient has completed XRT  History of enlarged heart  September 13 2022- Refer to cardiology       Cancer Staging  Breast cancer [facility]) Staging form: Breast, AJCC 8th Edition - Clinical stage from 09/13/2022: Stage IB (cT2, cN0, cM0, G1, ER+, PR+, HER2-) - Signed by Loni Muse, MD on 09/18/2022 Histopathologic type: Lobular carcinoma, NOS Stage prefix: Initial diagnosis Histologic grading system: 3 grade system    No problem-specific Assessment & Plan notes found  for this encounter.    No orders of the defined types were placed in this encounter.   40  minutes was spent in patient care.  This included time spent preparing to see the patient (e.g., review of tests), obtaining and/or reviewing separately obtained history, counseling and educating the patient/family/caregiver, ordering medications, tests, or procedures; documenting clinical information in the electronic or other health record, independently interpreting results and communicating results to the patient/family/caregiver as well as coordination of care.       All questions were answered. The patient knows to call the clinic with any problems, questions or concerns.  This note was electronically signed.    Loni Muse, MD  10/16/2022 8:39 AM

## 2022-10-16 NOTE — Telephone Encounter (Signed)
10/16/22 Spoke with patient and scheduled next appt.

## 2022-10-17 ENCOUNTER — Telehealth: Payer: Self-pay

## 2022-10-17 DIAGNOSIS — R4589 Other symptoms and signs involving emotional state: Secondary | ICD-10-CM | POA: Insufficient documentation

## 2022-10-17 NOTE — Telephone Encounter (Signed)
Phone contact attempted. No answer. Message left. Will try to contact again later in the week.

## 2022-10-23 NOTE — Progress Notes (Signed)
Face to face visit with pt in exam room. Pt had lumpectomy on June 24th, 2024. She will be having endocrine therapy after completing her radiation therapy. Pt is here today for her SIM. Pt reports that she was somewhat overwhelmed after getting her original diagnosis but feels that she is doing better now in dealing with her diagnosis and treatment. She will start her  radiation treaments on 10/31/22. Encouraged pt to contact navigator at anytime with questions or concerns.

## 2022-11-08 ENCOUNTER — Encounter: Payer: Self-pay | Admitting: Oncology

## 2022-11-27 ENCOUNTER — Inpatient Hospital Stay: Payer: Medicare Other | Attending: Oncology | Admitting: Oncology

## 2022-11-27 VITALS — BP 145/67 | HR 84 | Temp 97.7°F | Resp 18 | Ht 68.5 in | Wt 272.7 lb

## 2022-11-27 DIAGNOSIS — Z170 Estrogen receptor positive status [ER+]: Secondary | ICD-10-CM | POA: Diagnosis not present

## 2022-11-27 DIAGNOSIS — R4589 Other symptoms and signs involving emotional state: Secondary | ICD-10-CM | POA: Diagnosis not present

## 2022-11-27 DIAGNOSIS — C50412 Malignant neoplasm of upper-outer quadrant of left female breast: Secondary | ICD-10-CM | POA: Diagnosis not present

## 2022-11-27 DIAGNOSIS — Y633 Inadvertent exposure of patient to radiation during medical care: Secondary | ICD-10-CM

## 2022-11-27 DIAGNOSIS — R5383 Other fatigue: Secondary | ICD-10-CM

## 2022-11-27 MED ORDER — LETROZOLE 2.5 MG PO TABS: 2.5000 mg | ORAL_TABLET | Freq: Every day | ORAL | 1 refills | Status: AC

## 2022-11-27 NOTE — Progress Notes (Signed)
[facility] Cancer Initial Visit:  Patient Care Team: Maris Berger, MD as PCP - General (Family Medicine)  CHIEF COMPLAINTS/PURPOSE OF CONSULTATION:  Oncology History  Breast cancer (HCC)  09/13/2022 Initial Diagnosis   Breast cancer (HCC)   09/13/2022 Cancer Staging   Staging form: Breast, AJCC 8th Edition - Clinical stage from 09/13/2022: Stage IB (cT2, cN0, cM0, G1, ER+, PR+, HER2-) - Signed by Loni Muse, MD on 09/18/2022 Histopathologic type: Lobular carcinoma, NOS Stage prefix: Initial diagnosis Histologic grading system: 3 grade system     HISTORY OF PRESENTING ILLNESS: Hannah Holt 72 y.o. female is here because of breast cancer Medical history notable for arthritis, hepatitis B, nephrolithiasis, retention, IBS, sleep apnea.  Right knee arthroplasty (February 2024)  Jul 03 2022: Bilateral screening mammogram-and left breast possible area of asymmetry warranting further evaluation Jul 20, 2022: Diagnostic left mammogram and breast ultrasound suspicious 1.6 cm shadowing mass in left breast at 2:00.  Ultrasound left breast at 2:00, 3 cm from nipple showed hypoechoic ill-defined taller than wide area 0.8 x 1.6 x 1.1 cm.  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  Additional portion of medial margin show fibrocystic changes, usual ductal hyperplasia and apocrine metaplasia but was negative for residual malignancy Sentinel node biopsy was not performed Pathologic stage:  Stage IB (T2)     September 13 2022:  [facility] Medical Oncology Consult Reviewed recent  history with patient.  Patient states that she did not note any breast abnormalities and that her prior mammogram was 2 years prior.  Patient has a history of an enlarged heart, which was diagnosed 16 years ago.   She has not been seen by a cardiologist since then.   Patient is G0 P0.  Menarche 73 years of age.  Hysterectomy at 72 years of age.  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  Tobacco none.  EtOH rare  [facility] Mother died 19 heart disease Father died 1 heart disease No siblings.    WBC 9.3 hemoglobin 12.7 platelet count 266; 60 segs 20 lymphs 8 monos 6 eos 1 basophil CA 15-3 11.8 CMP Unremarkable  October 16 2022:  Reviewed results of labs with patient.  Discussed endocrine therapy and XRT.  Addressed her concerns.  Fatigued with no energy.  Using CPAP consistently.  Was unable to keep appointment with cardiology due to inclement weather.  Wants to reschedule upon completion of XRT.  Will hold on endocrine therapy until after XRT Oncotype DX RS 9 Distant recurrence risk at 9 years with endocrine therapy 3%  Benefit from chemotherapy < 1%  November 27 2022:  Scheduled follow up for management of breast cancer.  Has not completed XRT due to facility issues.  Fatigued but going to work daily; often falls asleep after coming home from work.  Has OSA and uses CPAP faithfully.  Some nights wakes up frequently but not certain why this is happening-- this started about 6 months ago.  She  is worried about finances and this may be contributing.   Discussed AI therapy again; she has agreed to a trial of therapy  November 30 2022:  To Complete XRT    Review of Systems  Constitutional:  Positive for fatigue. Negative for appetite change, chills, fever and unexpected weight change.  HENT:   Negative for lump/mass, mouth sores,  nosebleeds, sore throat, tinnitus, trouble swallowing and voice change.   Eyes:  Negative for eye problems and icterus.       Vision changes:  None  Respiratory:  Negative for chest tightness, cough, hemoptysis and wheezing.        Develops SOB with activity when its hot  Cardiovascular:  Positive for leg swelling.       Experiences intermittent left sided chest pain lasting up to 5 minutes not related to activity which spontaneously resolves.  She attributes this to a "gas pain".  Legs swell at end of the day.  Has seldom episodes of palpitations  Gastrointestinal:  Negative for abdominal pain, blood in stool, diarrhea, nausea and vomiting.       Chronic constipation  Endocrine:       Occasional hot flashes  Genitourinary:  Negative for bladder incontinence, difficulty urinating, dysuria, frequency, hematuria and nocturia.   Musculoskeletal:  Negative for arthralgias, back pain, gait problem, myalgias, neck pain and neck stiffness.  Skin:  Negative for itching, rash and wound.  Neurological:  Negative for dizziness, extremity weakness, gait problem, headaches, light-headedness, numbness, seizures and speech difficulty.  Hematological:  Negative for adenopathy. Does not bruise/bleed easily.  Psychiatric/Behavioral:  Negative for suicidal ideas. The patient is not nervous/anxious.        Sleep 3 to 5 hrs per night    MEDICAL HISTORY: Past Medical History:  Diagnosis Date   Anxiety    Arthritis    Asthma    pt. denies   Depression    Hepatitis B    when she was 72 years old   History of kidney stones    Hypertension    Hypothyroidism    IBS (irritable bowel syndrome)    Sleep apnea    Sleep apnea     SURGICAL HISTORY: Past Surgical History:  Procedure Laterality Date   ANKLE SURGERY Left    bladder reattached     BREAST BIOPSY Left    BREAST LUMPECTOMY Left    CHOLECYSTECTOMY     COLONOSCOPY  02/06/2014   Moderate predominantly left colonic diverticulosis. Internal  hemorrhoids. Limited examination due to quality.    KNEE SURGERY Bilateral    arthroscopic   KYPHOPLASTY     LITHOTRIPSY     TOTAL ABDOMINAL HYSTERECTOMY     TOTAL KNEE ARTHROPLASTY Right 04/21/2022   Procedure: TOTAL KNEE ARTHROPLASTY;  Surgeon: Beverely Low, MD;  Location: [facility];  Service: Orthopedics;  Laterality: Right;  general, spinal    SOCIAL HISTORY: Social History   Socioeconomic History   Marital status: Married    Spouse name: Not on file   Number of children: Not on file   Years of education: Not on file   Highest education level: Not on file  Occupational History   Not on file  Tobacco Use   Smoking status: Never   Smokeless tobacco: Never  Vaping Use   Vaping status: Never Used  Substance and Sexual Activity   Alcohol use: Yes    Comment: socially  holidays   Drug use: Never   Sexual activity: Not Currently  Other Topics  Concern   Not on file  Social History Narrative   Not on file   Social Determinants of Health   Financial Resource Strain: Not on file  Food Insecurity: No Food Insecurity (04/21/2022)   Hunger Vital Sign    Worried About Running Out of Food in the Last Year: Never true    Ran Out of Food in the Last Year: Never true  Transportation Needs: No Transportation Needs (04/21/2022)   PRAPARE - Administrator, Civil Service (Medical): No    Lack of Transportation (Non-Medical): No  Physical Activity: Not on file  Stress: Not on file  Social Connections: Not on file  Intimate Partner Violence: Not At Risk (04/21/2022)   Humiliation, Afraid, Rape, and Kick questionnaire    Fear of Current or Ex-Partner: No    Emotionally Abused: No    Physically Abused: No    Sexually Abused: No    FAMILY HISTORY Family History  Problem Relation Age of Onset   Heart disease Mother    Heart disease Father    Thyroid disease Neg Hx    Colon cancer Neg Hx    Esophageal cancer Neg Hx    Rectal cancer Neg Hx     ALLERGIES:  has No Known  Allergies.  MEDICATIONS:  Current Outpatient Medications  Medication Sig Dispense Refill   albuterol (VENTOLIN HFA) 108 (90 Base) MCG/ACT inhaler Inhale 2 puffs into the lungs every 6 (six) hours as needed for wheezing or shortness of breath.     amLODipine (NORVASC) 2.5 MG tablet Take 2.5 mg by mouth daily.     levothyroxine (SYNTHROID) 100 MCG tablet Take 100 mcg by mouth daily before breakfast.     NON FORMULARY Pt uses a cpap nightly     No current facility-administered medications for this visit.    PHYSICAL EXAMINATION:  ECOG PERFORMANCE STATUS: 0 - Asymptomatic   There were no vitals filed for this visit.    There were no vitals filed for this visit.     Physical Exam Vitals and nursing note reviewed.  Constitutional:      Appearance: Normal appearance. She is not toxic-appearing or diaphoretic.  HENT:     Head: Normocephalic and atraumatic.     Right Ear: External ear normal.     Left Ear: External ear normal.     Nose: Nose normal. No congestion or rhinorrhea.  Eyes:     General: No scleral icterus.    Extraocular Movements: Extraocular movements intact.     Conjunctiva/sclera: Conjunctivae normal.     Pupils: Pupils are equal, round, and reactive to light.  Cardiovascular:     Rate and Rhythm: Normal rate.     Heart sounds: No murmur heard.    No friction rub. No gallop.  Pulmonary:     Effort: Pulmonary effort is normal. No respiratory distress.     Breath sounds: Normal breath sounds. No stridor. No wheezing.  Abdominal:     General: Bowel sounds are normal.     Palpations: Abdomen is soft.     Tenderness: There is no abdominal tenderness. There is no guarding or rebound.  Musculoskeletal:        General: No swelling, tenderness or deformity.     Cervical back: Normal range of motion and neck supple. No rigidity or tenderness.  Lymphadenopathy:     Head:     Right side of head: No submental, submandibular, tonsillar, preauricular, posterior  auricular or occipital adenopathy.  Left side of head: No submental, submandibular, tonsillar, preauricular, posterior auricular or occipital adenopathy.     Cervical: No cervical adenopathy.     Right cervical: No superficial, deep or posterior cervical adenopathy.    Left cervical: No superficial, deep or posterior cervical adenopathy.     Upper Body:     Right upper body: No supraclavicular, axillary, pectoral or epitrochlear adenopathy.     Left upper body: No supraclavicular, axillary, pectoral or epitrochlear adenopathy.  Skin:    General: Skin is warm.     Coloration: Skin is not jaundiced.     Findings: No bruising.  Neurological:     General: No focal deficit present.     Mental Status: She is alert and oriented to person, place, and time.     Cranial Nerves: No cranial nerve deficit.     Motor: No weakness.  Psychiatric:        Mood and Affect: Mood normal.        Behavior: Behavior normal.        Thought Content: Thought content normal.        Judgment: Judgment normal.    LABORATORY DATA: I have personally reviewed the data as listed:  No visits with results within 1 Month(s) from this visit.  Latest known visit with results is:  Appointment on 09/13/2022  Component Date Value Ref Range Status   CA 15-3 09/13/2022 11.8  0.0 - 25.0 U/mL Final   Comment: (NOTE) Roche [employer]ostics Electrochemiluminescence Immunoassay (ECLIA) Values obtained with different assay methods or kits cannot be used interchangeably.  Results cannot be interpreted as absolute evidence of the presence or absence of malignant disease. Performed At: [facility] [address] [place], [place] [id number] Jolene Schimke MD QI:[id number]     RADIOGRAPHIC STUDIES: I have personally reviewed the radiological images as listed and agree with the findings in the report  No results found.  ASSESSMENT/PLAN 72 y.o. female is here because of breast cancer.  Medical history notable for  arthritis, hepatitis B, nephrolithiasis, retention, IBS, sleep apnea.  Right knee arthroplasty (February 2024)  Lobular carcinoma, left breast,  clinical Stage IB (T2 N0 M0) ER 95%/PR 95% HER2/neu negative/Ki-67 2%/ Oncotype DX RS 9     Jul 03 2022: Bilateral screening mammogram- left breast  area of asymmetry warranting further evaluation Jul 20, 2022: Diagnostic left mammogram and breast ultrasound suspicious 1.6 cm shadowing mass in left breast at 2:00.  Ultrasound left breast at 2:00, 3 cm from nipple showed hypoechoic ill-defined taller than wide area 0.8 x 1.6 x 1.1 cm.  Ultrasound left axilla demonstrated normal lymph nodes   August 04, 2022: Core needle biopsy of left breast mass- Invasive lobular carcinoma grade 1 along with lobular carcinoma in situ IHC ER 95%/PR 95% HER2/neu negative/Ki-67 2%    August 21, 2022: Lumpectomy without sentinel LN bx.  Invasive lobular carcinoma 28 mm in size, and lobular carcinoma in situ all margins negative.    Therapeutics:  For control of the breast patient underwent lumpectomy.  Evaluation of the axillary lymph nodes occurred by U/S; a sentinel lymph node biopsy was not performed.  Given that the tumor is ER positive, hormone therapy will be offered.   October 16 2022- Oncotype DX indicates no benefit from chemotherapy therefore recommended institution of adjuvant endocrine therapy.  Has been seen by Radiation oncology to discuss adjuvant  XRT.  Patient hesitant to proceed with either as she is concerned about potential side effects, particularly worsening  fatigue.  Discussed the importance of adjuvant treatment in preventing recurrence.  At end of visit agreed to proceed with XRT and consider adjuvant hormonal therapy after radiation is complete November 27 2022- Agreed to a trial of AI therapy   November 30 2022- To complete adjuvant XRT  History of colon polyps and new diagnosis of breast cancer  September 13 2022- Consider genetics referral  October 17 2022-  Will discuss after patient has completed XRT  History of enlarged heart  September 13 2022- Refer to cardiology  Fatigue  November 27 2022- Etiology likely multifactorial.  Has OSA and uses CPAP faithfully.  Sleeps poorly at night in part due to anxiety.  XRT may be contributing  Discussed sleep hygiene.         Cancer Staging  Breast cancer [facility]) Staging form: Breast, AJCC 8th Edition - Clinical stage from 09/13/2022: Stage IB (cT2, cN0, cM0, G1, ER+, PR+, HER2-) - Signed by Loni Muse, MD on 09/18/2022 Histopathologic type: Lobular carcinoma, NOS Stage prefix: Initial diagnosis Histologic grading system: 3 grade system    No problem-specific Assessment & Plan notes found for this encounter.    No orders of the defined types were placed in this encounter.   30  minutes was spent in patient care.  This included time spent preparing to see the patient (e.g., review of tests), obtaining and/or reviewing separately obtained history, counseling and educating the patient/family/caregiver, ordering medications, tests, or procedures; documenting clinical information in the electronic or other health record, independently interpreting results and communicating results to the patient/family/caregiver as well as coordination of care.       All questions were answered. The patient knows to call the clinic with any problems, questions or concerns.  This note was electronically signed.    Loni Muse, MD  11/27/2022 8:39 AM

## 2022-11-28 ENCOUNTER — Telehealth: Payer: Self-pay | Admitting: Oncology

## 2022-11-28 NOTE — Telephone Encounter (Signed)
Patient has been scheduled for follow-up visit per 11/27/22 LOS.  Pt's spouse given appt date and time.

## 2022-12-01 ENCOUNTER — Encounter: Payer: Self-pay | Admitting: Oncology

## 2022-12-04 ENCOUNTER — Telehealth: Payer: Self-pay

## 2022-12-04 NOTE — Telephone Encounter (Signed)
[facility] Clinical Social Work  Clinical Social Work was referred by medical provider for assessment of psychosocial needs.  Clinical Social Worker attempted to contact patient by phone to offer support and assess for needs. CSW unable to make contact at this time, left vm with direct contact.   Marguerita Merles, LCSWA  Clinical Social Worker [facility]

## 2022-12-06 DIAGNOSIS — Y633 Inadvertent exposure of patient to radiation during medical care: Secondary | ICD-10-CM | POA: Insufficient documentation

## 2022-12-06 DIAGNOSIS — R5383 Other fatigue: Secondary | ICD-10-CM | POA: Insufficient documentation

## 2022-12-25 ENCOUNTER — Inpatient Hospital Stay: Payer: Medicare Other | Attending: Oncology | Admitting: Oncology

## 2022-12-25 ENCOUNTER — Telehealth: Payer: Self-pay | Admitting: Genetic Counselor

## 2022-12-25 VITALS — BP 132/68 | HR 78 | Temp 98.2°F | Resp 18 | Ht 68.5 in | Wt 271.9 lb

## 2022-12-25 DIAGNOSIS — C50412 Malignant neoplasm of upper-outer quadrant of left female breast: Secondary | ICD-10-CM

## 2022-12-25 DIAGNOSIS — Z9889 Other specified postprocedural states: Secondary | ICD-10-CM

## 2022-12-25 DIAGNOSIS — Z91148 Patient's other noncompliance with medication regimen for other reason: Secondary | ICD-10-CM | POA: Diagnosis not present

## 2022-12-25 DIAGNOSIS — Z170 Estrogen receptor positive status [ER+]: Secondary | ICD-10-CM

## 2022-12-25 NOTE — Progress Notes (Signed)
[facility] Cancer Initial Visit:  Patient Care Team: Maris Berger, MD as PCP - General (Family Medicine)  CHIEF COMPLAINTS/PURPOSE OF CONSULTATION:  Oncology History  Breast cancer (HCC)  09/13/2022 Initial Diagnosis   Breast cancer (HCC)   09/13/2022 Cancer Staging   Staging form: Breast, AJCC 8th Edition - Clinical stage from 09/13/2022: Stage IB (cT2, cN0, cM0, G1, ER+, PR+, HER2-) - Signed by Loni Muse, MD on 09/18/2022 Histopathologic type: Lobular carcinoma, NOS Stage prefix: Initial diagnosis Histologic grading system: 3 grade system     HISTORY OF PRESENTING ILLNESS: Hannah Holt 72 y.o. female is here because of breast cancer Medical history notable for arthritis, hepatitis B, nephrolithiasis, retention, IBS, sleep apnea.  Right knee arthroplasty (February 2024)  Jul 03 2022: Bilateral screening mammogram-and left breast possible area of asymmetry warranting further evaluation Jul 20, 2022: Diagnostic left mammogram and breast ultrasound suspicious 1.6 cm shadowing mass in left breast at 2:00.  Ultrasound left breast at 2:00, 3 cm from nipple showed hypoechoic ill-defined taller than wide area 0.8 x 1.6 x 1.1 cm.  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  Additional portion of medial margin show fibrocystic changes, usual ductal hyperplasia and apocrine metaplasia but was negative for residual malignancy Sentinel node biopsy was not performed Pathologic stage:  Stage IB (T2)     September 13 2022:  [facility] Medical Oncology Consult Reviewed recent  history with patient.  Patient states that she did not note any breast abnormalities and that her prior mammogram was 2 years prior.  Patient has a history of an enlarged heart, which was diagnosed 16 years ago.   She has not been seen by a cardiologist since then.   Patient is G0 P0.  Menarche 72 years of age.  Hysterectomy at 72 years of age.  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  Tobacco none.  EtOH rare  [facility] Mother died 65 heart disease Father died 22 heart disease No siblings.    WBC 9.3 hemoglobin 12.7 platelet count 266; 60 segs 20 lymphs 8 monos 6 eos 1 basophil CA 15-3 11.8 CMP Unremarkable  October 16 2022:  Reviewed results of labs with patient.  Discussed endocrine therapy and XRT.  Addressed her concerns.  Fatigued with no energy.  Using CPAP consistently.  Was unable to keep appointment with cardiology due to inclement weather.  Wants to reschedule upon completion of XRT.  Will hold on endocrine therapy until after XRT Oncotype DX RS 9 Distant recurrence risk at 9 years with endocrine therapy 3%  Benefit from chemotherapy < 1%  November 27 2022:    Has not completed XRT due to facility issues.  Fatigued but going to work daily; often falls asleep after coming home from work.  Has OSA and uses CPAP faithfully.  Some nights wakes up frequently but not certain why this is happening-- this started about 6 months ago.  She is worried about finances and this may  be contributing.   Discussed AI therapy again; she has agreed to a trial of therapy  November 30 2022:  Completed XRT  December 25 2022:   Scheduled follow up for management of breast cancer.  Has decided not to take Letrazole.  Gets up in the middle of the night and sometimes watches TV before falling asleep.  Difficulty in getting back to sleep in part is  due to anxiety.    May 2025:  Due for mammogram    Review of Systems  Constitutional:  Positive for fatigue. Negative for appetite change, chills, fever and unexpected weight change.  HENT:   Negative for lump/mass, mouth sores, nosebleeds, sore throat, tinnitus, trouble swallowing and voice change.   Eyes:  Negative for eye problems and icterus.       Vision changes:  None  Respiratory:  Negative for chest tightness, cough, hemoptysis and wheezing.        Develops SOB with activity when its hot  Cardiovascular:  Positive for leg swelling. Negative for chest pain and palpitations.  Gastrointestinal:  Negative for abdominal pain, blood in stool, diarrhea, nausea and vomiting.       Chronic constipation  Endocrine:       Occasional hot flashes  Genitourinary:  Negative for bladder incontinence, difficulty urinating, dysuria, frequency, hematuria and nocturia.   Musculoskeletal:  Negative for arthralgias, back pain, gait problem, myalgias, neck pain and neck stiffness.  Skin:  Negative for itching, rash and wound.  Neurological:  Negative for dizziness, extremity weakness, gait problem, headaches, light-headedness, numbness, seizures and speech difficulty.  Hematological:  Negative for adenopathy. Does not bruise/bleed easily.  Psychiatric/Behavioral:  Negative for suicidal ideas. The patient is not nervous/anxious.        Sleep 3 to 5 hrs per night    MEDICAL HISTORY: Past Medical History:  Diagnosis Date   Anxiety    Arthritis    Asthma    pt. denies   Depression    Hepatitis B    when she was 72 years old   History of kidney stones    Hypertension    Hypothyroidism    IBS (irritable bowel syndrome)    Sleep apnea    Sleep apnea     SURGICAL HISTORY: Past Surgical History:  Procedure Laterality Date   ANKLE SURGERY Left    bladder reattached     BREAST BIOPSY Left    BREAST LUMPECTOMY Left    CHOLECYSTECTOMY     COLONOSCOPY  02/06/2014   Moderate predominantly left  colonic diverticulosis. Internal hemorrhoids. Limited examination due to quality.    KNEE SURGERY Bilateral    arthroscopic   KYPHOPLASTY     LITHOTRIPSY     TOTAL ABDOMINAL HYSTERECTOMY     TOTAL KNEE ARTHROPLASTY Right 04/21/2022   Procedure: TOTAL KNEE ARTHROPLASTY;  Surgeon: Beverely Low, MD;  Location: [facility];  Service: Orthopedics;  Laterality: Right;  general, spinal    SOCIAL HISTORY: Social History   Socioeconomic History   Marital status: Married    Spouse name: Not on file   Number of children: Not on file   Years of education: Not on file   Highest education level: Not on file  Occupational History   Not on file  Tobacco Use   Smoking status: Never   Smokeless tobacco: Never  Vaping Use   Vaping status: Never Used  Substance and Sexual Activity   Alcohol use: Yes    Comment: socially  holidays   Drug  use: Never   Sexual activity: Not Currently  Other Topics Concern   Not on file  Social History Narrative   Not on file   Social Determinants of Health   Financial Resource Strain: Not on file  Food Insecurity: No Food Insecurity (04/21/2022)   Hunger Vital Sign    Worried About Running Out of Food in the Last Year: Never true    Ran Out of Food in the Last Year: Never true  Transportation Needs: No Transportation Needs (04/21/2022)   PRAPARE - Administrator, Civil Service (Medical): No    Lack of Transportation (Non-Medical): No  Physical Activity: Not on file  Stress: Not on file  Social Connections: Not on file  Intimate Partner Violence: Not At Risk (04/21/2022)   Humiliation, Afraid, Rape, and Kick questionnaire    Fear of Current or Ex-Partner: No    Emotionally Abused: No    Physically Abused: No    Sexually Abused: No    FAMILY HISTORY Family History  Problem Relation Age of Onset   Heart disease Mother    Heart disease Father    Thyroid disease Neg Hx    Colon cancer Neg Hx    Esophageal cancer Neg Hx    Rectal cancer Neg Hx      ALLERGIES:  has No Known Allergies.  MEDICATIONS:  Current Outpatient Medications  Medication Sig Dispense Refill   albuterol (VENTOLIN HFA) 108 (90 Base) MCG/ACT inhaler Inhale 2 puffs into the lungs every 6 (six) hours as needed for wheezing or shortness of breath.     amLODipine (NORVASC) 2.5 MG tablet Take 2.5 mg by mouth daily.     levothyroxine (SYNTHROID) 100 MCG tablet Take 100 mcg by mouth daily before breakfast.     NON FORMULARY Pt uses a cpap nightly     letrozole (FEMARA) 2.5 MG tablet Take 1 tablet (2.5 mg total) by mouth daily. (Patient not taking: Reported on 12/25/2022) 90 tablet 1   No current facility-administered medications for this visit.    PHYSICAL EXAMINATION:  ECOG PERFORMANCE STATUS: 0 - Asymptomatic   Vitals:   12/25/22 0831  BP: 132/68  Pulse: 78  Resp: 18  Temp: 98.2 F (36.8 C)  SpO2: 97%      Filed Weights   12/25/22 0831  Weight: 271 lb 14.4 oz (123.3 kg)       Physical Exam Vitals and nursing note reviewed.  Constitutional:      Appearance: Normal appearance. She is not toxic-appearing or diaphoretic.  HENT:     Head: Normocephalic and atraumatic.     Right Ear: External ear normal.     Left Ear: External ear normal.     Nose: Nose normal. No congestion or rhinorrhea.  Eyes:     General: No scleral icterus.    Extraocular Movements: Extraocular movements intact.     Conjunctiva/sclera: Conjunctivae normal.     Pupils: Pupils are equal, round, and reactive to light.  Cardiovascular:     Rate and Rhythm: Normal rate.     Heart sounds: No murmur heard.    No friction rub. No gallop.  Pulmonary:     Effort: Pulmonary effort is normal. No respiratory distress.     Breath sounds: Normal breath sounds. No stridor. No wheezing.  Abdominal:     General: Bowel sounds are normal.     Palpations: Abdomen is soft.     Tenderness: There is no abdominal tenderness. There is no guarding or  rebound.  Musculoskeletal:         General: No swelling, tenderness or deformity.     Cervical back: Normal range of motion and neck supple. No rigidity or tenderness.  Lymphadenopathy:     Head:     Right side of head: No submental, submandibular, tonsillar, preauricular, posterior auricular or occipital adenopathy.     Left side of head: No submental, submandibular, tonsillar, preauricular, posterior auricular or occipital adenopathy.     Cervical: No cervical adenopathy.     Right cervical: No superficial, deep or posterior cervical adenopathy.    Left cervical: No superficial, deep or posterior cervical adenopathy.     Upper Body:     Right upper body: No supraclavicular, axillary, pectoral or epitrochlear adenopathy.     Left upper body: No supraclavicular, axillary, pectoral or epitrochlear adenopathy.  Skin:    General: Skin is warm.     Coloration: Skin is not jaundiced.     Findings: No bruising.  Neurological:     General: No focal deficit present.     Mental Status: She is alert and oriented to person, place, and time.     Cranial Nerves: No cranial nerve deficit.     Motor: No weakness.  Psychiatric:        Mood and Affect: Mood normal.        Behavior: Behavior normal.        Thought Content: Thought content normal.        Judgment: Judgment normal.     LABORATORY DATA: I have personally reviewed the data as listed:  No visits with results within 1 Month(s) from this visit.  Latest known visit with results is:  Appointment on 09/13/2022  Component Date Value Ref Range Status   CA 15-3 09/13/2022 11.8  0.0 - 25.0 U/mL Final   Comment: (NOTE) Roche [employer]ostics Electrochemiluminescence Immunoassay (ECLIA) Values obtained with different assay methods or kits cannot be used interchangeably.  Results cannot be interpreted as absolute evidence of the presence or absence of malignant disease. Performed At: [facility] [address] [place], [place] [id number] Jolene Schimke MD QI:[id number]      RADIOGRAPHIC STUDIES: I have personally reviewed the radiological images as listed and agree with the findings in the report  No results found.  ASSESSMENT/PLAN 72 y.o. female is here because of breast cancer.  Medical history notable for arthritis, hepatitis B, nephrolithiasis, retention, IBS, sleep apnea.  Right knee arthroplasty (February 2024)  Lobular carcinoma, left breast,  clinical Stage IB (T2 N0 M0) ER 95%/PR 95% HER2/neu negative/Ki-67 2%/ Oncotype DX RS 9     Jul 03 2022: Bilateral screening mammogram- left breast  area of asymmetry warranting further evaluation Jul 20, 2022: Diagnostic left mammogram and breast ultrasound suspicious 1.6 cm shadowing mass in left breast at 2:00.  Ultrasound left breast at 2:00, 3 cm from nipple showed hypoechoic ill-defined taller than wide area 0.8 x 1.6 x 1.1 cm.  Ultrasound left axilla demonstrated normal lymph nodes   August 04, 2022: Core needle biopsy of left breast mass- Invasive lobular carcinoma grade 1 along with lobular carcinoma in situ IHC ER 95%/PR 95% HER2/neu negative/Ki-67 2%    August 21, 2022: Lumpectomy without sentinel LN bx.  Invasive lobular carcinoma 28 mm in size, and lobular carcinoma in situ all margins negative.    Therapeutics:  For control of the breast patient underwent lumpectomy.  Evaluation of the axillary lymph nodes occurred by U/S; a sentinel lymph node biopsy  was not performed.  Given that the tumor is ER positive, hormone therapy will be offered.   October 16 2022- Oncotype DX indicates no benefit from chemotherapy therefore recommended institution of adjuvant endocrine therapy.  Has been seen by Radiation oncology to discuss adjuvant  XRT.  Patient hesitant to proceed with either as she is concerned about potential side effects, particularly worsening fatigue.  Discussed the importance of adjuvant treatment in preventing recurrence.  At end of visit agreed to proceed with XRT and consider adjuvant hormonal therapy  after radiation is complete November 27 2022- Agreed to a trial of AI therapy   November 30 2022- Completed adjuvant XRT December 25 2022-  Declines to use letrazole  History of colon polyps and new diagnosis of breast cancer  September 13 2022- Consider genetics referral  October 17 2022- Will discuss after patient has completed XRT  December 25 2022:  Patient agrees to [facility] Referral    History of enlarged heart  September 13 2022- Refered to cardiology  Fatigue  November 27 2022- Etiology likely multifactorial.  Has OSA and uses CPAP faithfully.  Sleeps poorly at night in part due to anxiety.  XRT may be contributing  Discussed sleep hygiene.     December 25 2022- Discussed sleep issues       Cancer Staging  Breast cancer [facility]) Staging form: Breast, AJCC 8th Edition - Clinical stage from 09/13/2022: Stage IB (cT2, cN0, cM0, G1, ER+, PR+, HER2-) - Signed by Loni Muse, MD on 09/18/2022 Histopathologic type: Lobular carcinoma, NOS Stage prefix: Initial diagnosis Histologic grading system: 3 grade system    No problem-specific Assessment & Plan notes found for this encounter.    No orders of the defined types were placed in this encounter.   30  minutes was spent in patient care.  This included time spent preparing to see the patient (e.g., review of tests), obtaining and/or reviewing separately obtained history, counseling and educating the patient/family/caregiver, ordering medications, tests, or procedures; documenting clinical information in the electronic or other health record, independently interpreting results and communicating results to the patient/family/caregiver as well as coordination of care.       All questions were answered. The patient knows to call the clinic with any problems, questions or concerns.  This note was electronically signed.    Loni Muse, MD  12/25/2022 8:43 AM

## 2022-12-25 NOTE — Telephone Encounter (Signed)
Scheduled appointments per referral. Patient is aware of the appointment time and date as well as the address. Patient was informed to arrive 10-15 minutes prior with updated insurance information. All questions were answered.   Zollie Beckers (spouse) states will have wife call to confirm if appointment works for her because she works. Appointment was made for patient with the intent of keeping.

## 2022-12-27 ENCOUNTER — Other Ambulatory Visit: Payer: Medicare Other

## 2022-12-27 ENCOUNTER — Inpatient Hospital Stay: Payer: Medicare Other | Admitting: Genetic Counselor

## 2023-01-04 ENCOUNTER — Ambulatory Visit
Admission: RE | Admit: 2023-01-04 | Discharge: 2023-01-04 | Disposition: A | Discharge status: Home / Self Care | Payer: Medicare Other | Source: Ambulatory Visit | Attending: Radiation Oncology | Admitting: Radiation Oncology

## 2023-01-04 VITALS — BP 124/87 | HR 70 | Temp 97.9°F | Resp 18 | Ht 69.0 in | Wt 273.6 lb

## 2023-01-04 DIAGNOSIS — C50412 Malignant neoplasm of upper-outer quadrant of left female breast: Secondary | ICD-10-CM

## 2023-01-04 NOTE — Progress Notes (Signed)
Diagnosis: Invasive carcinoma of the left breast  HPI: The patient completed adjuvant breast radiation approximately 1 month ago.  This is her first follow-up visit.  She was prescribed letrozole, but chose not to take it secondary to concern about side effects.  ROS: He reports that her energy level is improving.  She reports no pain in either breast.  Her skin continues to slowly peel.  She reports no breast itching.  She reports no fevers or chills.  PE: Weight to 73.6.  Examination of her left breast reveals scattered areas of residual peeling.  A/P: She is doing well approximately 1 month out from completion of adjuvant breast radiation.  Her radiation related side effects are slowly resolving.  She has chosen not to take letrozole.  She is scheduled to see Dr. Angelene Giovanni early next year.  She will continue to follow-up routinely with medical oncology, further follow-up in our department will be on an as needed basis; I encouraged her however to contact me at anytime with any questions or concerns she may have.    Dejanae Helser A. Thersa Salt, MD

## 2023-03-27 ENCOUNTER — Ambulatory Visit: Payer: Medicare Other | Admitting: Oncology

## 2023-04-02 ENCOUNTER — Telehealth: Payer: Self-pay | Admitting: Oncology

## 2023-04-02 NOTE — Telephone Encounter (Signed)
Patient called on 04/02/2023 945am to cancel genetic referral appointment on 04/09/2023 10am. Her reasons are : no children to pass on to and at her age is not interested in doing anything further Thank you

## 2023-04-09 ENCOUNTER — Other Ambulatory Visit: Payer: Medicare Other

## 2023-04-09 ENCOUNTER — Encounter: Payer: Medicare Other | Admitting: Genetic Counselor

## 2023-07-30 ENCOUNTER — Telehealth: Payer: Self-pay | Admitting: Oncology

## 2023-07-30 NOTE — Telephone Encounter (Signed)
 07/30/23 Patient left message that she no longer wants to come to our office.Requested appts to be cancelled.Spoke with Dr Melony Squibb office and informed them of patients wishes.

## 2023-08-14 ENCOUNTER — Ambulatory Visit: Admitting: Oncology

## 2023-12-06 ENCOUNTER — Encounter: Payer: Self-pay | Admitting: Gastroenterology
